# Patient Record
Sex: Female | Born: 2000 | Race: White | Hispanic: No | Marital: Single | Smoking: Never smoker
Health system: Southern US, Community
[De-identification: ages and names within clinical notes are randomized; demographics above are authoritative.]

## PROBLEM LIST (undated history)

## (undated) DIAGNOSIS — G43909 Migraine, unspecified, not intractable, without status migrainosus: Secondary | ICD-10-CM

## (undated) HISTORY — PX: TYMPANOSTOMY TUBE PLACEMENT: SHX32

---
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
## 2020-02-02 ENCOUNTER — Encounter (HOSPITAL_BASED_OUTPATIENT_CLINIC_OR_DEPARTMENT_OTHER): Payer: Self-pay | Admitting: *Deleted

## 2020-02-02 ENCOUNTER — Emergency Department (HOSPITAL_BASED_OUTPATIENT_CLINIC_OR_DEPARTMENT_OTHER): Payer: BLUE CROSS/BLUE SHIELD

## 2020-02-02 ENCOUNTER — Other Ambulatory Visit: Payer: Self-pay

## 2020-02-02 ENCOUNTER — Inpatient Hospital Stay (HOSPITAL_BASED_OUTPATIENT_CLINIC_OR_DEPARTMENT_OTHER)
Admission: EM | Admit: 2020-02-02 | Discharge: 2020-02-04 | DRG: 060 | Disposition: A | Discharge status: Home / Self Care | Payer: BLUE CROSS/BLUE SHIELD | Attending: Internal Medicine | Admitting: Internal Medicine

## 2020-02-02 ENCOUNTER — Emergency Department (HOSPITAL_COMMUNITY): Payer: BLUE CROSS/BLUE SHIELD

## 2020-02-02 DIAGNOSIS — R112 Nausea with vomiting, unspecified: Secondary | ICD-10-CM | POA: Diagnosis not present

## 2020-02-02 DIAGNOSIS — G35 Multiple sclerosis: Principal | ICD-10-CM | POA: Diagnosis present

## 2020-02-02 DIAGNOSIS — Z833 Family history of diabetes mellitus: Secondary | ICD-10-CM | POA: Diagnosis not present

## 2020-02-02 DIAGNOSIS — G43409 Hemiplegic migraine, not intractable, without status migrainosus: Secondary | ICD-10-CM | POA: Diagnosis not present

## 2020-02-02 DIAGNOSIS — G9389 Other specified disorders of brain: Secondary | ICD-10-CM

## 2020-02-02 DIAGNOSIS — G379 Demyelinating disease of central nervous system, unspecified: Secondary | ICD-10-CM | POA: Diagnosis present

## 2020-02-02 DIAGNOSIS — E860 Dehydration: Secondary | ICD-10-CM | POA: Diagnosis not present

## 2020-02-02 DIAGNOSIS — Z20822 Contact with and (suspected) exposure to covid-19: Secondary | ICD-10-CM | POA: Diagnosis not present

## 2020-02-02 DIAGNOSIS — Z8249 Family history of ischemic heart disease and other diseases of the circulatory system: Secondary | ICD-10-CM | POA: Diagnosis not present

## 2020-02-02 HISTORY — DX: Migraine, unspecified, not intractable, without status migrainosus: G43.909

## 2020-02-02 LAB — CBC WITH DIFFERENTIAL/PLATELET
Abs Immature Granulocytes: 0.01 10*3/uL (ref 0.00–0.07)
Basophils Absolute: 0.1 10*3/uL (ref 0.0–0.1)
Basophils Relative: 1 %
Eosinophils Absolute: 0.3 10*3/uL (ref 0.0–0.5)
Eosinophils Relative: 5 %
HCT: 41.8 % (ref 36.0–46.0)
Hemoglobin: 13.8 g/dL (ref 12.0–15.0)
Immature Granulocytes: 0 %
Lymphocytes Relative: 33 %
Lymphs Abs: 2.4 10*3/uL (ref 0.7–4.0)
MCH: 28.9 pg (ref 26.0–34.0)
MCHC: 33 g/dL (ref 30.0–36.0)
MCV: 87.6 fL (ref 80.0–100.0)
Monocytes Absolute: 0.7 10*3/uL (ref 0.1–1.0)
Monocytes Relative: 9 %
Neutro Abs: 3.7 10*3/uL (ref 1.7–7.7)
Neutrophils Relative %: 52 %
Platelets: 258 10*3/uL (ref 150–400)
RBC: 4.77 MIL/uL (ref 3.87–5.11)
RDW: 12 % (ref 11.5–15.5)
WBC: 7.2 10*3/uL (ref 4.0–10.5)
nRBC: 0 % (ref 0.0–0.2)

## 2020-02-02 LAB — BASIC METABOLIC PANEL
Anion gap: 11 (ref 5–15)
BUN: 7 mg/dL (ref 6–20)
CO2: 25 mmol/L (ref 22–32)
Calcium: 9.5 mg/dL (ref 8.9–10.3)
Chloride: 105 mmol/L (ref 98–111)
Creatinine, Ser: 0.66 mg/dL (ref 0.44–1.00)
GFR calc Af Amer: >60 mL/min (ref >60)
GFR calc non Af Amer: >60 mL/min (ref >60)
Glucose, Bld: 93 mg/dL (ref 70–99)
Potassium: 4 mmol/L (ref 3.5–5.1)
Sodium: 141 mmol/L (ref 135–145)

## 2020-02-02 LAB — PREGNANCY, URINE: Preg Test, Ur: NEGATIVE

## 2020-02-02 IMAGING — MR MR CERVICAL SPINE WO/W CM
6 series · 31 of 48 positions shown · IV contrast (gadavist)
Comparison: Prior head CT from earlier the same day.

EXAM:
MRI HEAD WITHOUT AND WITH CONTRAST

MRI CERVICAL SPINE WITHOUT AND WITH CONTRAST
MRI THORACIC SPINE WITHOUT AND WITH CONTRAST
TECHNIQUE: Multiplanar, multiecho pulse sequences of the brain and surrounding
structures, and cervical spine, to include the craniocervical
junction and cervicothoracic junction, and thoracic spine were
obtained without and with intravenous contrast.
CONTRAST:  9mL GADAVIST GADOBUTROL 1 MMOL/ML IV SOLN

[Series 9: T2 · sagittal · 3.0mm · 0.86mm/px · 4 of 15 slices shown (1 of 2)]
[im 1/15]
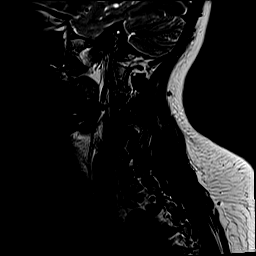
[im 5/15]
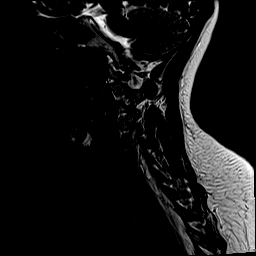
[im 10/15]
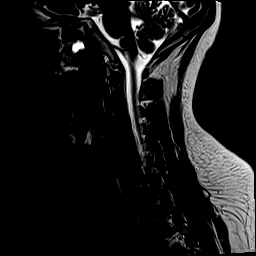
[im 15/15]
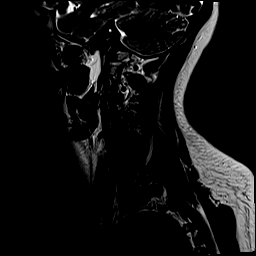

[Series 10: T1 · sagittal · 3.0mm · 0.86mm/px · 4 of 15 slices shown (1 of 2)]
[im 1/15]
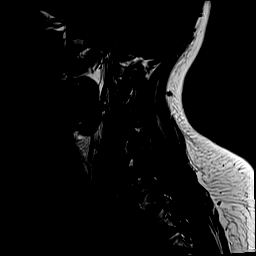
[im 5/15]
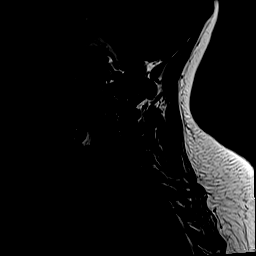
[im 10/15]
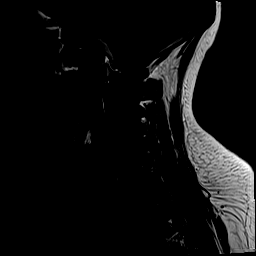
[im 15/15]
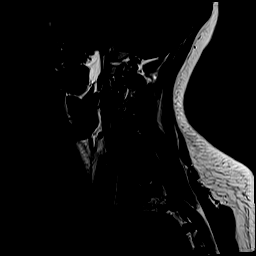

[Series 11: STIR · sagittal · 3.0mm · 0.86mm/px · 4 of 15 slices shown]
[im 1/15]
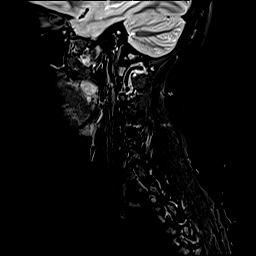
[im 5/15]
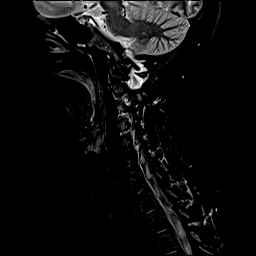
[im 10/15]
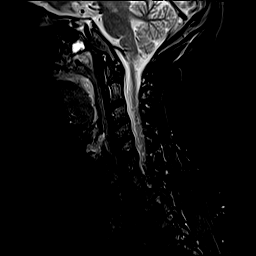
[im 15/15]
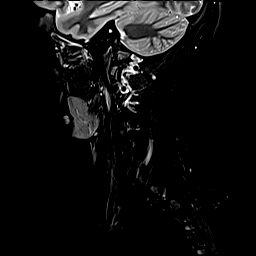

[Series 12: T2 · axial · 3.0mm · 0.78mm/px · z∈[-315,-193]mm · 9 of 40 slices shown (2 of 2)]
[im 1/40]
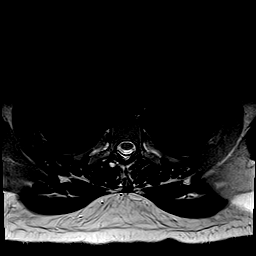
[im 8/40]
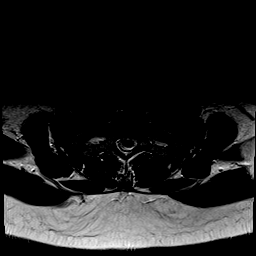
[im 11/40]
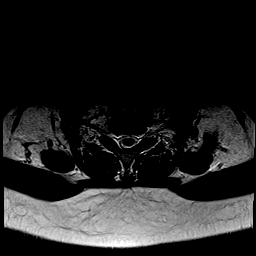
[im 18/40]
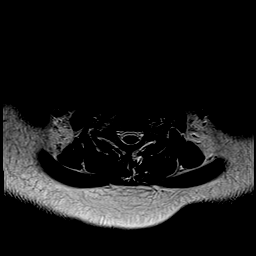
[im 22/40]
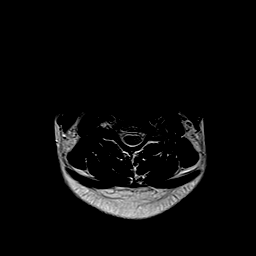
[im 29/40]
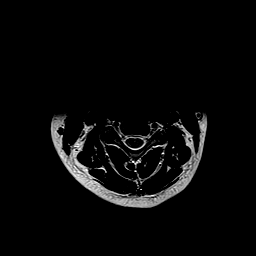
[im 32/40]
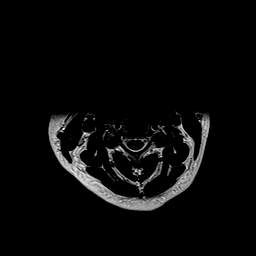
[im 36/40]
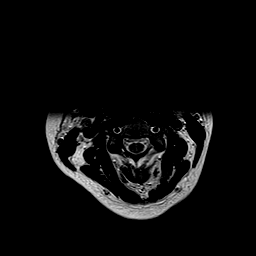
[im 40/40]
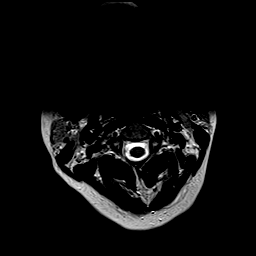

[Series 13: GRE · axial · 3.0mm · 0.39mm/px · 1 of 40 slices shown]
[im 1/40]
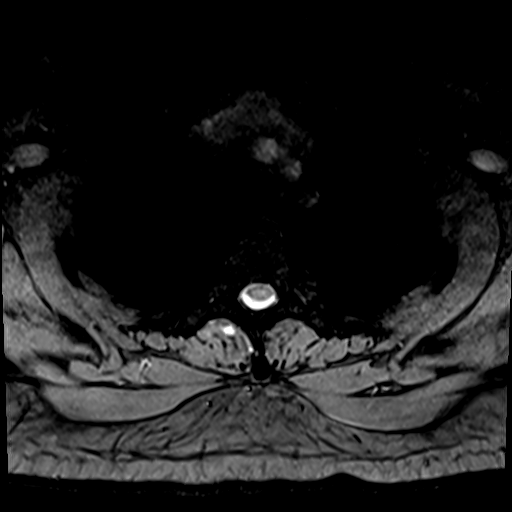

[Series 14: T1 · axial · 3.0mm · 0.39mm/px · z∈[-315,-193]mm · 9 of 40 slices shown (2 of 2)]
[im 1/40]
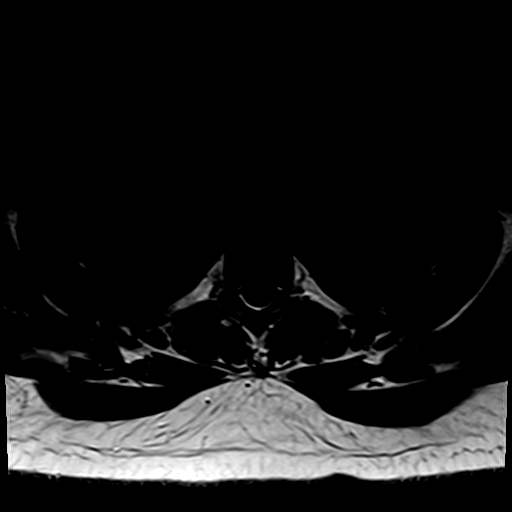
[im 8/40]
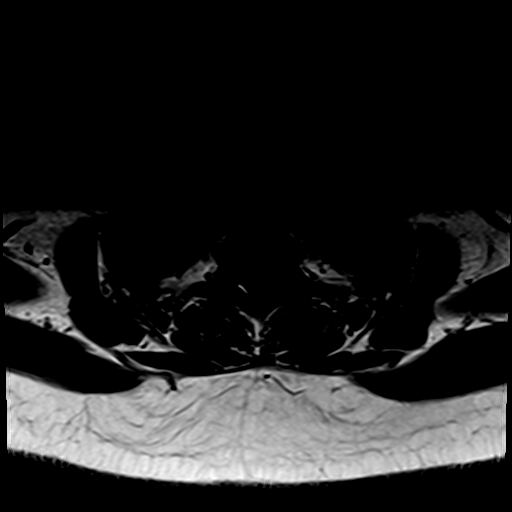
[im 11/40]
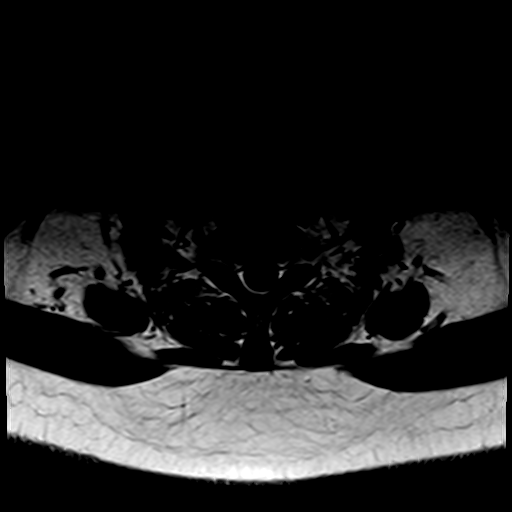
[im 18/40]
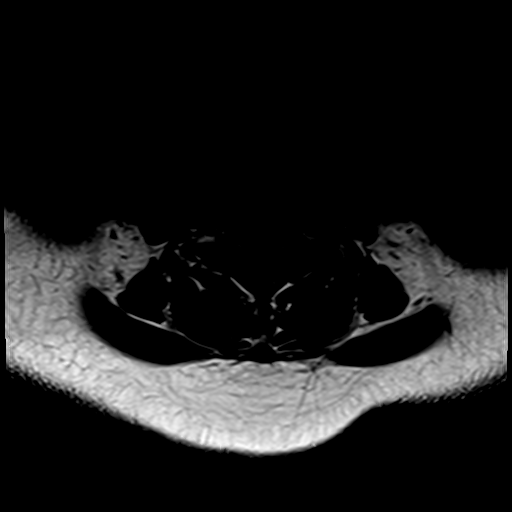
[im 22/40]
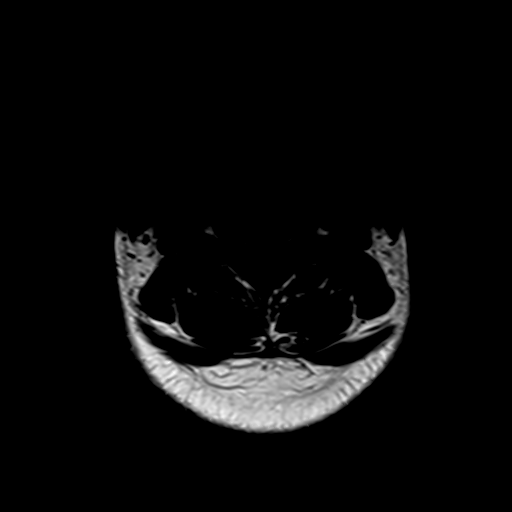
[im 29/40]
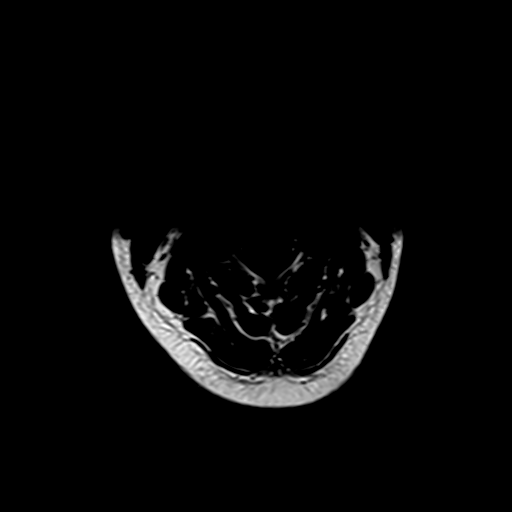
[im 32/40]
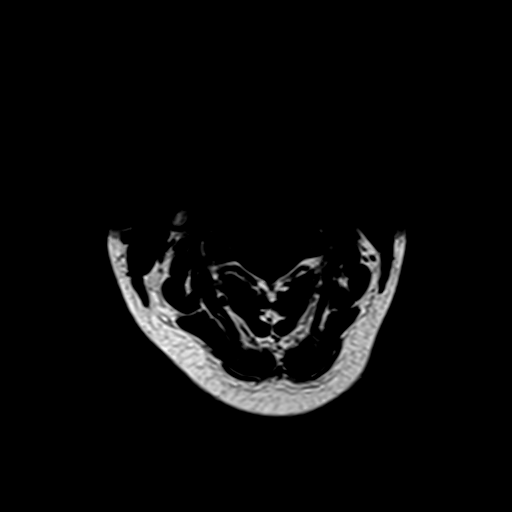
[im 36/40]
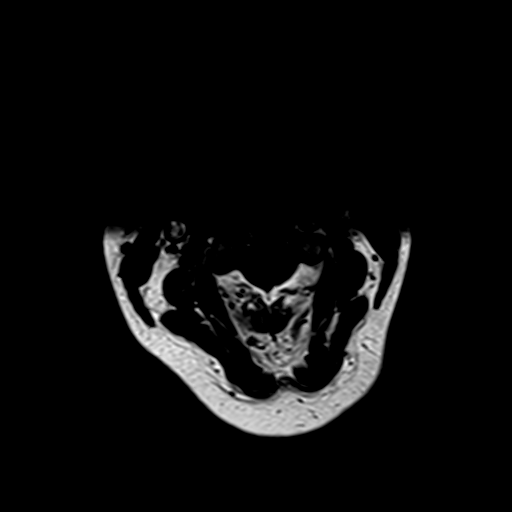
[im 40/40]
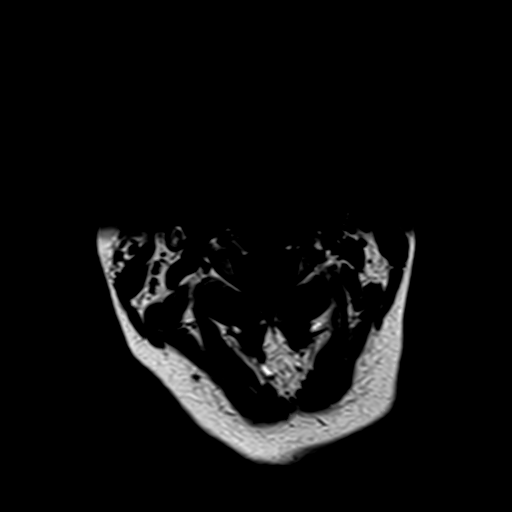

[31 of 48 positions shown; findings below may reference images not displayed]

FINDINGS: MRI HEAD FINDINGS

Brain: Cerebral volume within normal limits. Multiple scattered foci
of T2/FLAIR hyperintensity seen involving the periventricular, deep,
and subcortical white matter of both cerebral hemispheres, highly
suspicious for demyelinating disease. Several of these foci are
oriented perpendicular to the lateral ventricles. A dominant
subcortical lesion measuring 1.6 cm noted at the subcortical left
parietal lobe (series 12, image 15). At least 1 infratentorial
lesion seen at the left cerebellum. Following contrast
administration, patchy enhancement seen about several lesions, most
notable at the left parietal lobe (series 49, image 90 and right
frontal lobe (series 49, image 96).

No evidence for acute or subacute infarct. Gray-white matter
differentiation maintained. No encephalomalacia to suggest chronic
cortical infarction. No evidence for acute or chronic intracranial
hemorrhage.

No mass lesion, midline shift or mass effect. No hydrocephalus or
extra-axial fluid collection. Pituitary gland within normal limits
for age. Midline structures intact.

Vascular: Major intracranial vascular flow voids are maintained.

Skull and upper cervical spine: Craniocervical junction within
normal limits. Bone marrow signal intensity normal. No scalp soft
tissue abnormality.

Sinuses/Orbits: Globes orbital soft tissues within normal limits.
Small left maxillary sinus retention cyst. Paranasal sinuses are
otherwise clear. No significant mastoid effusion. Inner ear
structures grossly normal.

Other: None.

MRI CERVICAL SPINE FINDINGS

Alignment: Straightening of the normal cervical lordosis. No
listhesis.

Vertebrae: Vertebral body height well maintained without acute or
chronic fracture. Bone marrow signal intensity within normal limits.
No discrete or worrisome osseous lesions. No abnormal marrow edema
or enhancement.

Cord: Patchy signal abnormality seen at the medulla/craniocervical
junction (series 11, image 9). Additional subtle patchy signal
abnormality within the left dorsal cord at the level of C2-3 (series
11, image 9). Focal cord signal abnormality within the
central/dorsal cord at the level of C3-4 (series 11, image 8) patchy
cord signal abnormality at the dorsal cord at the level of C5
(series 11, image 8). Additional vague signal abnormality at the
level of C6. Findings consistent with demyelinating disease. No
abnormal enhancement to suggest active demyelination. Underlying
cord caliber within normal limits.

Posterior Fossa, vertebral arteries, paraspinal tissues: Negative.

Disc levels: No significant underlying disc pathology. No stenosis
or neural impingement.

MRI THORACIC SPINE FINDINGS

Alignment: Vertebral bodies normally aligned with preservation of
the normal thoracic kyphosis. No listhesis.

Vertebrae: Vertebral body height maintained without acute or chronic
fracture. Bone marrow signal intensity within normal limits. No
discrete or worrisome osseous lesions. No abnormal marrow edema or
enhancement.

Cord: No convincing cord signal abnormality seen within the thoracic
spine to suggest demyelinating disease. Underlying cord caliber
within normal limits. No abnormal enhancement.

Paraspinous soft tissues: Negative.

Disc levels: Minimal/early spondylosis noted at T5-6 and T6-7
without significant stenosis or impingement. Minimal facet
hypertrophy noted within the lower thoracic spine. No significant
stenosis or neural impingement.
IMPRESSION: MRI HEAD IMPRESSION:

1. Patchy multifocal signal abnormality involving both the
supratentorial and infratentorial cerebral white matter, compatible
with demyelinating disease/multiple sclerosis. Patchy post-contrast
enhancement about several lesions consistent with active
demyelination.
2. Otherwise normal brain MRI.

MRI CERVICAL SPINE IMPRESSION:

1. Patchy multifocal signal abnormality throughout the cervical
spinal cord extending from the brainstem through C6, consistent with
demyelinating disease/multiple sclerosis. No evidence for active
demyelination within the cervical spine.
2. Otherwise normal MRI of the cervical spine.

MRI THORACIC SPINE IMPRESSION:

Negative MRI of the thoracic spine. No convincing cord signal
changes to suggest involvement with demyelinating disease. No
abnormal enhancement.

## 2020-02-02 IMAGING — MR MR HEAD WO/W CM
4 of 5 series · 20 of 48 positions shown · IV contrast (9 ML GADAVIST)
Comparison: Prior head CT from earlier the same day.

EXAM:
MRI HEAD WITHOUT AND WITH CONTRAST

MRI CERVICAL SPINE WITHOUT AND WITH CONTRAST
MRI THORACIC SPINE WITHOUT AND WITH CONTRAST
TECHNIQUE: Multiplanar, multiecho pulse sequences of the brain and surrounding
structures, and cervical spine, to include the craniocervical
junction and cervicothoracic junction, and thoracic spine were
obtained without and with intravenous contrast.
CONTRAST:  9mL GADAVIST GADOBUTROL 1 MMOL/ML IV SOLN

[Series 48: T2 post-contrast · coronal · 5.0mm · 0.72mm/px · 5 of 28 slices shown]
[im 1/28]
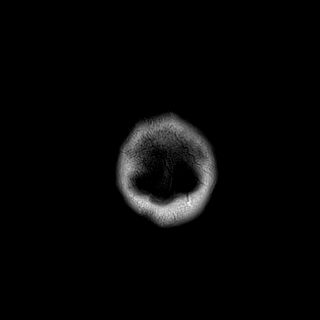
[im 7/28]
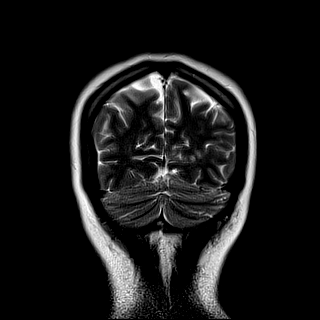
[im 14/28]
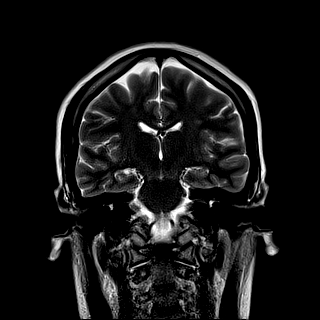
[im 21/28]
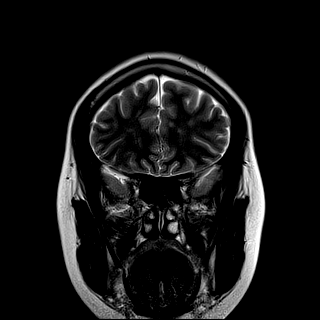
[im 28/28]
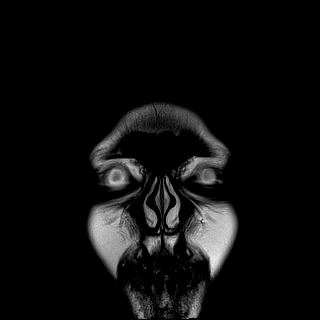

[Series 50: T1 post-contrast · coronal · 5.0mm · 0.34mm/px · 5 of 29 slices shown (1 of 3)]
[im 1/29]
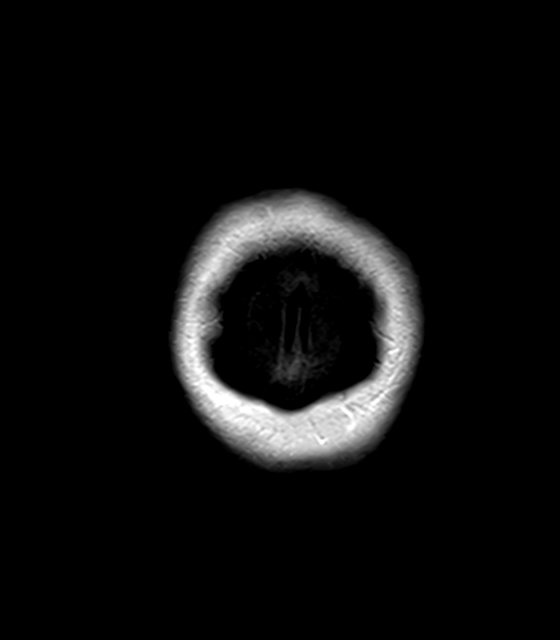
[im 8/29]
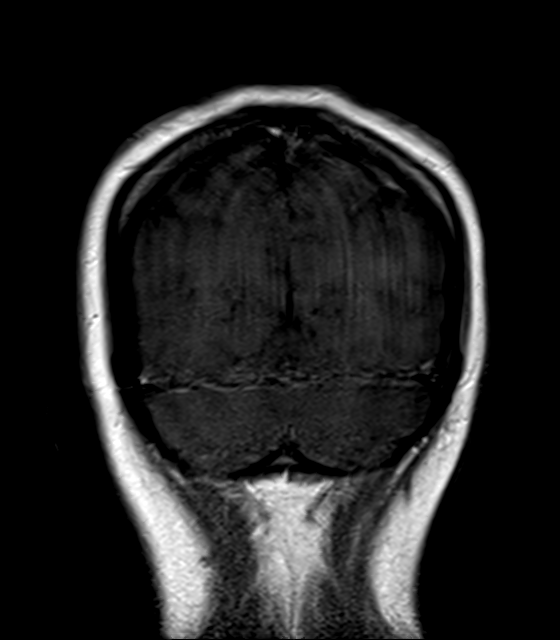
[im 15/29]
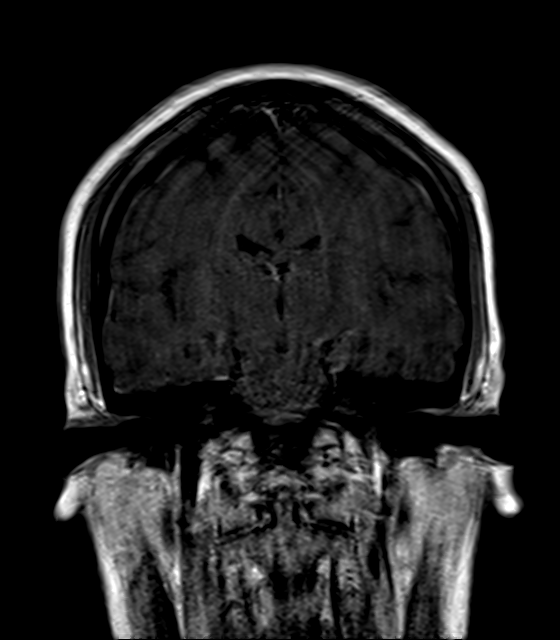
[im 22/29]
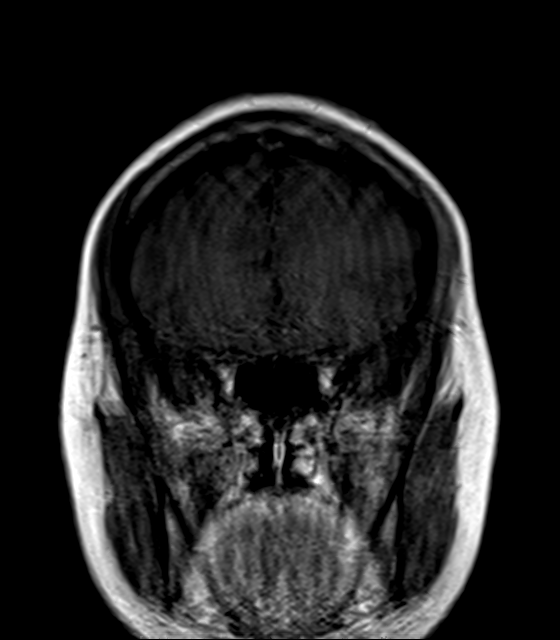
[im 29/29]
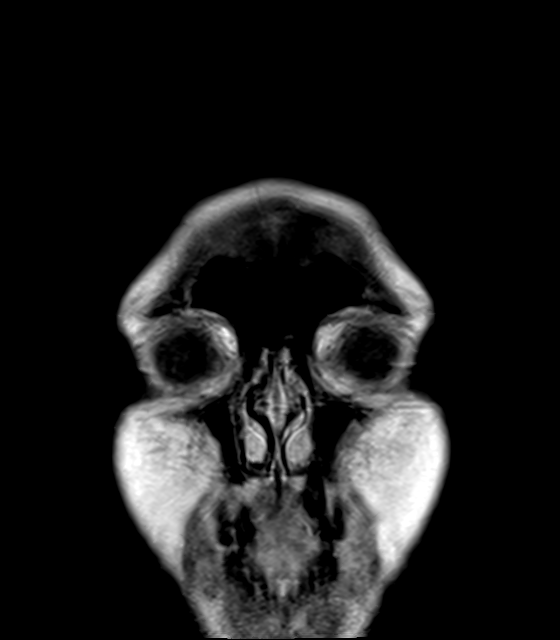

[Series 51: T1 post-contrast · sagittal · 4.0mm · 0.90mm/px · 5 of 31 slices shown (2 of 3)]
[im 1/31]
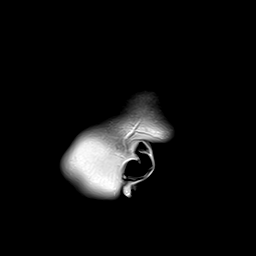
[im 8/31]
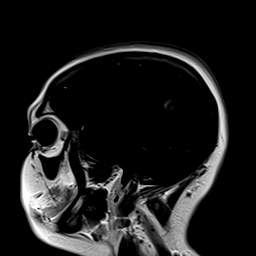
[im 16/31]
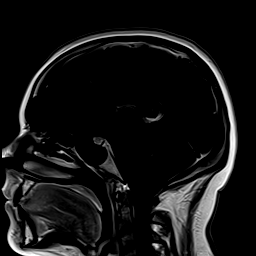
[im 23/31]
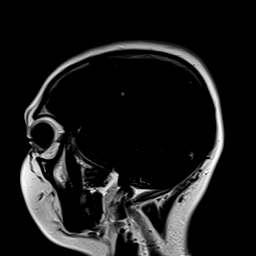
[im 31/31]
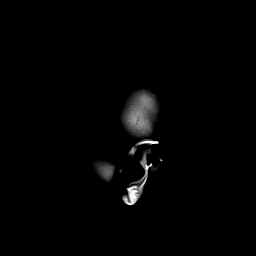

[Series 52: T1 post-contrast · coronal · 5.0mm · 0.43mm/px · 5 of 29 slices shown (3 of 3)]
[im 1/29]
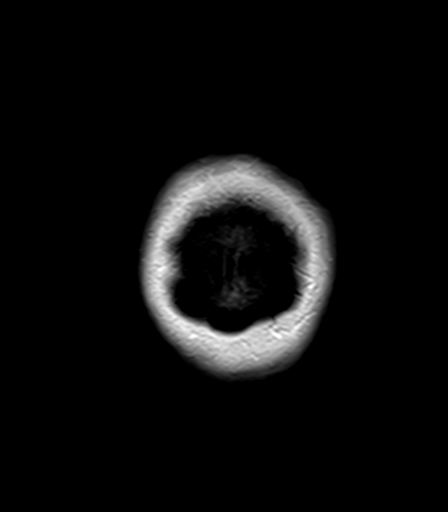
[im 8/29]
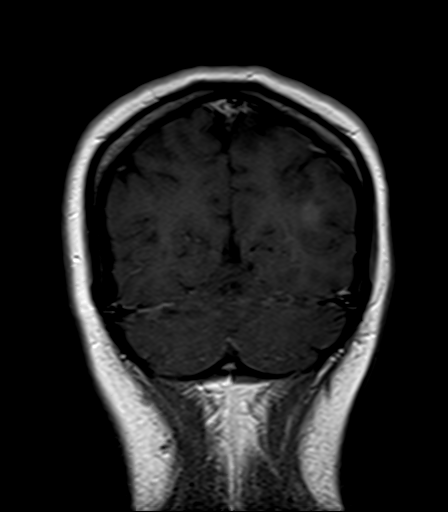
[im 15/29]
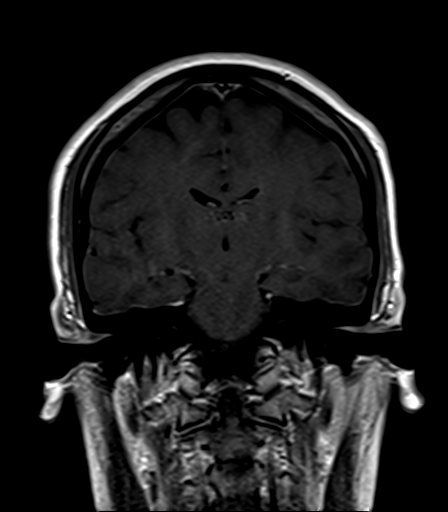
[im 22/29]
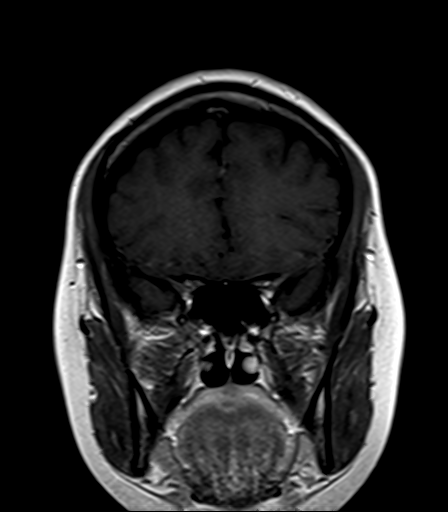
[im 29/29]
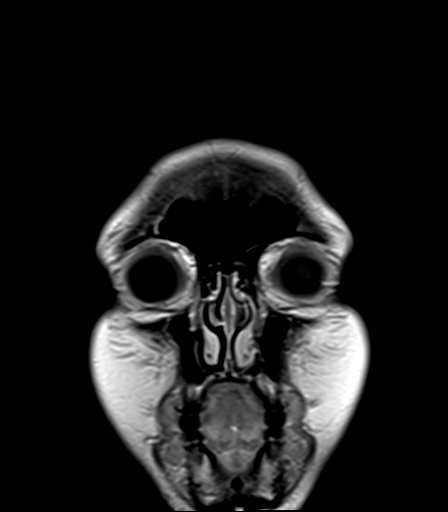

[20 of 48 positions shown; findings below may reference images not displayed]

FINDINGS: MRI HEAD FINDINGS

Brain: Cerebral volume within normal limits. Multiple scattered foci
of T2/FLAIR hyperintensity seen involving the periventricular, deep,
and subcortical white matter of both cerebral hemispheres, highly
suspicious for demyelinating disease. Several of these foci are
oriented perpendicular to the lateral ventricles. A dominant
subcortical lesion measuring 1.6 cm noted at the subcortical left
parietal lobe (series 12, image 15). At least 1 infratentorial
lesion seen at the left cerebellum. Following contrast
administration, patchy enhancement seen about several lesions, most
notable at the left parietal lobe (series 49, image 90 and right
frontal lobe (series 49, image 96).

No evidence for acute or subacute infarct. Gray-white matter
differentiation maintained. No encephalomalacia to suggest chronic
cortical infarction. No evidence for acute or chronic intracranial
hemorrhage.

No mass lesion, midline shift or mass effect. No hydrocephalus or
extra-axial fluid collection. Pituitary gland within normal limits
for age. Midline structures intact.

Vascular: Major intracranial vascular flow voids are maintained.

Skull and upper cervical spine: Craniocervical junction within
normal limits. Bone marrow signal intensity normal. No scalp soft
tissue abnormality.

Sinuses/Orbits: Globes orbital soft tissues within normal limits.
Small left maxillary sinus retention cyst. Paranasal sinuses are
otherwise clear. No significant mastoid effusion. Inner ear
structures grossly normal.

Other: None.

MRI CERVICAL SPINE FINDINGS

Alignment: Straightening of the normal cervical lordosis. No
listhesis.

Vertebrae: Vertebral body height well maintained without acute or
chronic fracture. Bone marrow signal intensity within normal limits.
No discrete or worrisome osseous lesions. No abnormal marrow edema
or enhancement.

Cord: Patchy signal abnormality seen at the medulla/craniocervical
junction (series 11, image 9). Additional subtle patchy signal
abnormality within the left dorsal cord at the level of C2-3 (series
11, image 9). Focal cord signal abnormality within the
central/dorsal cord at the level of C3-4 (series 11, image 8) patchy
cord signal abnormality at the dorsal cord at the level of C5
(series 11, image 8). Additional vague signal abnormality at the
level of C6. Findings consistent with demyelinating disease. No
abnormal enhancement to suggest active demyelination. Underlying
cord caliber within normal limits.

Posterior Fossa, vertebral arteries, paraspinal tissues: Negative.

Disc levels: No significant underlying disc pathology. No stenosis
or neural impingement.

MRI THORACIC SPINE FINDINGS

Alignment: Vertebral bodies normally aligned with preservation of
the normal thoracic kyphosis. No listhesis.

Vertebrae: Vertebral body height maintained without acute or chronic
fracture. Bone marrow signal intensity within normal limits. No
discrete or worrisome osseous lesions. No abnormal marrow edema or
enhancement.

Cord: No convincing cord signal abnormality seen within the thoracic
spine to suggest demyelinating disease. Underlying cord caliber
within normal limits. No abnormal enhancement.

Paraspinous soft tissues: Negative.

Disc levels: Minimal/early spondylosis noted at T5-6 and T6-7
without significant stenosis or impingement. Minimal facet
hypertrophy noted within the lower thoracic spine. No significant
stenosis or neural impingement.
IMPRESSION: MRI HEAD IMPRESSION:

1. Patchy multifocal signal abnormality involving both the
supratentorial and infratentorial cerebral white matter, compatible
with demyelinating disease/multiple sclerosis. Patchy post-contrast
enhancement about several lesions consistent with active
demyelination.
2. Otherwise normal brain MRI.

MRI CERVICAL SPINE IMPRESSION:

1. Patchy multifocal signal abnormality throughout the cervical
spinal cord extending from the brainstem through C6, consistent with
demyelinating disease/multiple sclerosis. No evidence for active
demyelination within the cervical spine.
2. Otherwise normal MRI of the cervical spine.

MRI THORACIC SPINE IMPRESSION:

Negative MRI of the thoracic spine. No convincing cord signal
changes to suggest involvement with demyelinating disease. No
abnormal enhancement.

## 2020-02-02 IMAGING — CT CT HEAD W/O CM
3 series · 14 of 47 positions shown, 16 images · non-contrast
Comparison: [DATE]

CLINICAL DATA: Headache, migraine without history of trauma. Nausea
and vomiting this morning.

EXAM:
CT HEAD WITHOUT CONTRAST
TECHNIQUE: Contiguous axial images were obtained from the base of the skull
through the vertex without intravenous contrast.

[Series 2: head wo · axial · 0.44mm/px · z∈[+796,+920]mm · 8 of 31 slices shown, 10 images]
[im 3/31  brain]
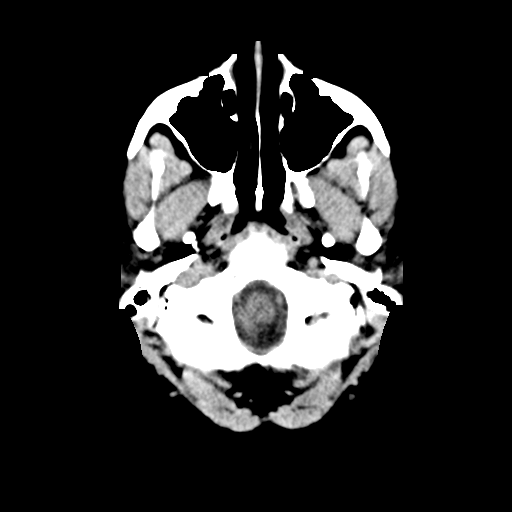
[im 3/31  bone]
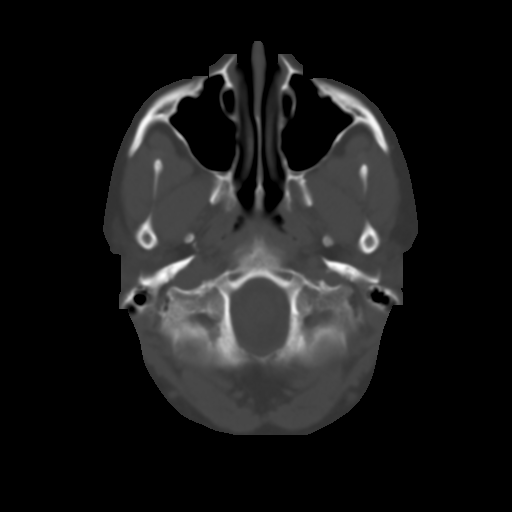
[im 7/31  brain]
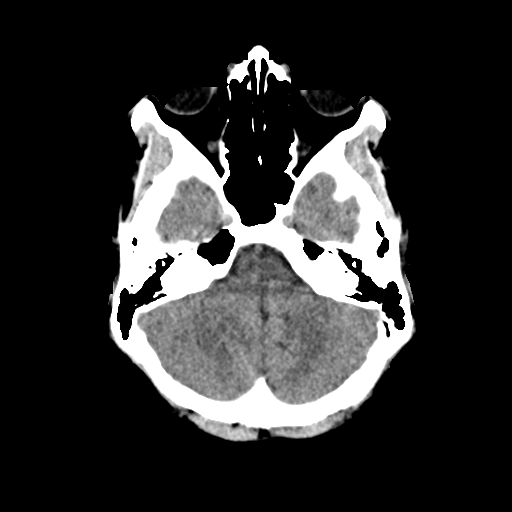
[im 10/31  brain]
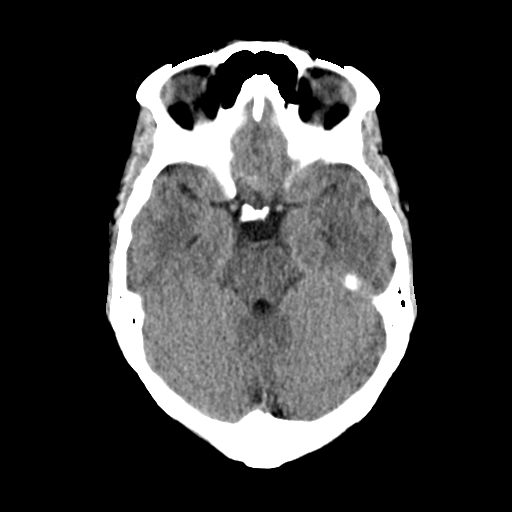
[im 14/31  brain]
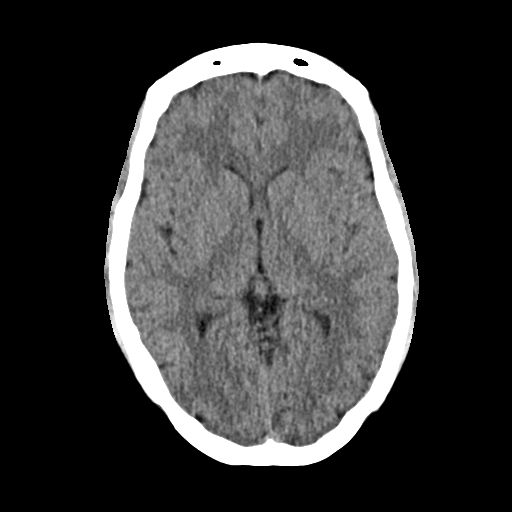
[im 17/31  brain]
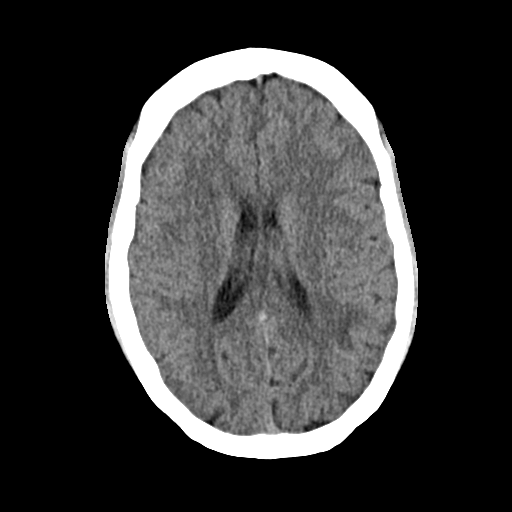
[im 17/31  bone]
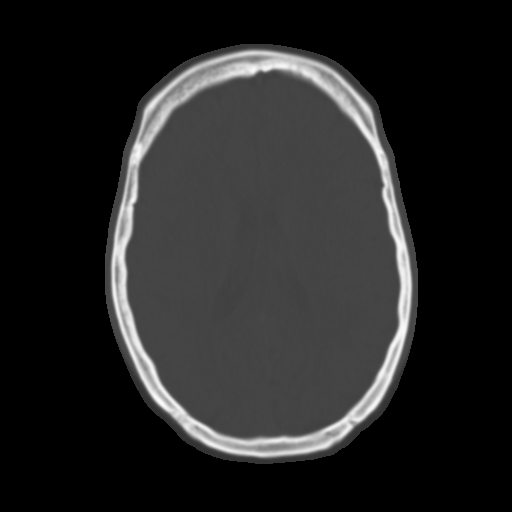
[im 21/31  brain]
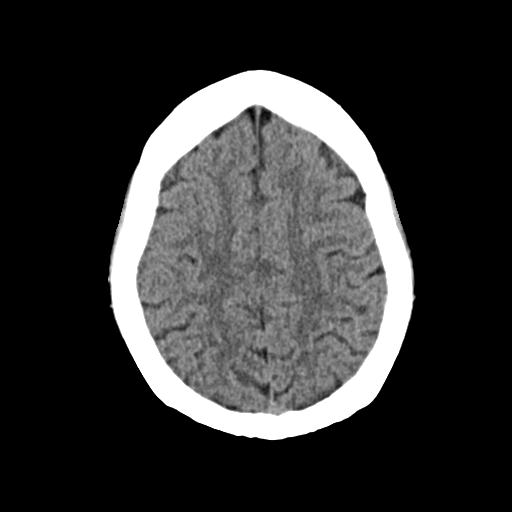
[im 24/31  brain]
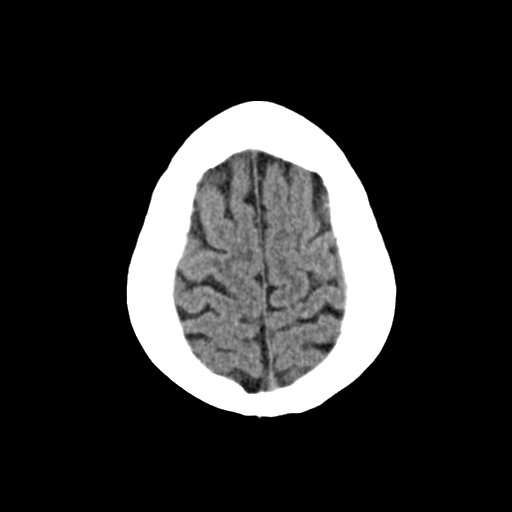
[im 28/31  brain]
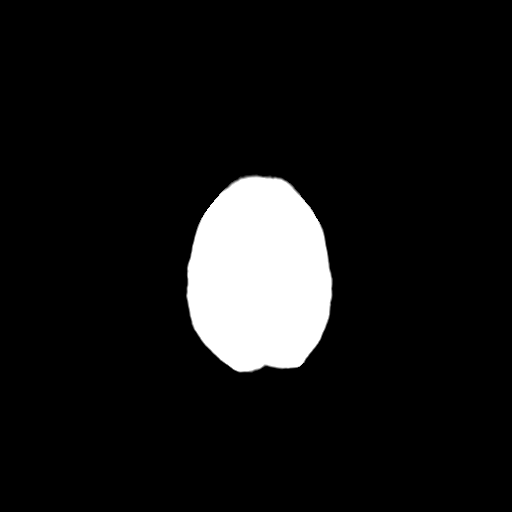

[Series 4: coronal soft · coronal · 0.31mm/px · 3 of 67 slices shown]
[im 23/67  brain]
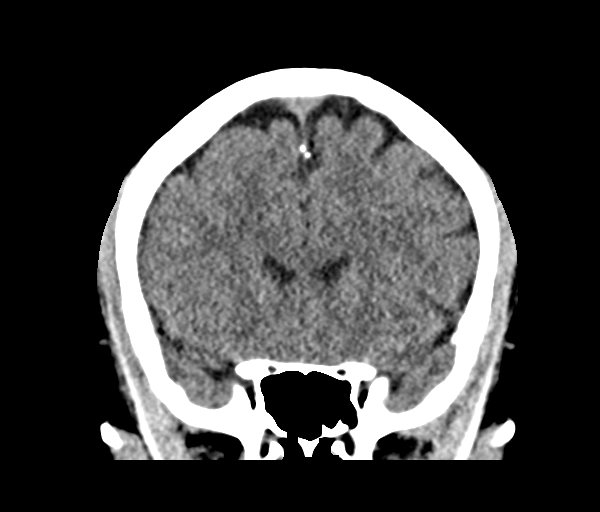
[im 30/67  brain]
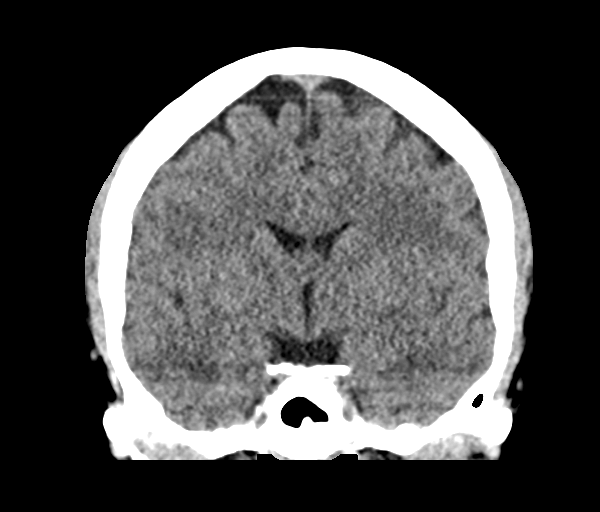
[im 37/67  brain]
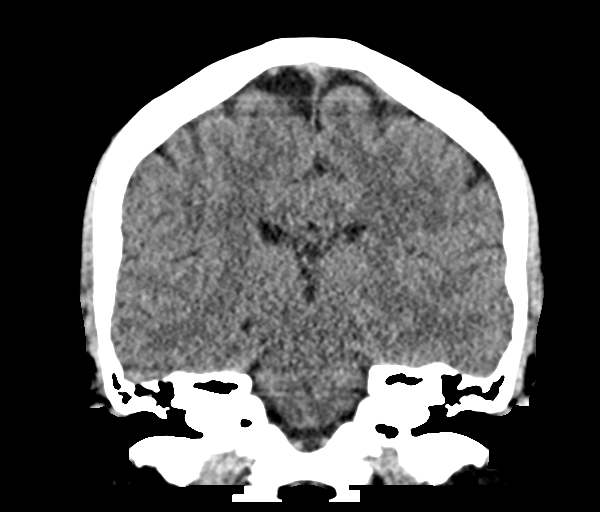

[Series 5: sag soft · sagittal · 0.31mm/px · 3 of 55 slices shown]
[im 19/55  brain]
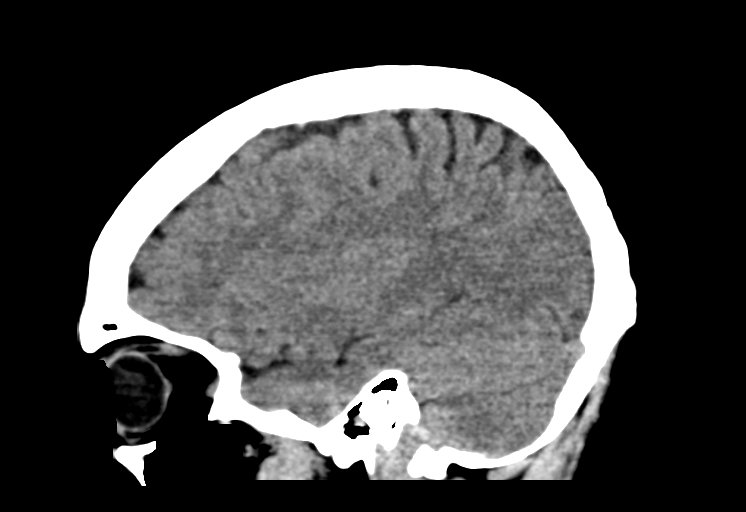
[im 28/55  brain]
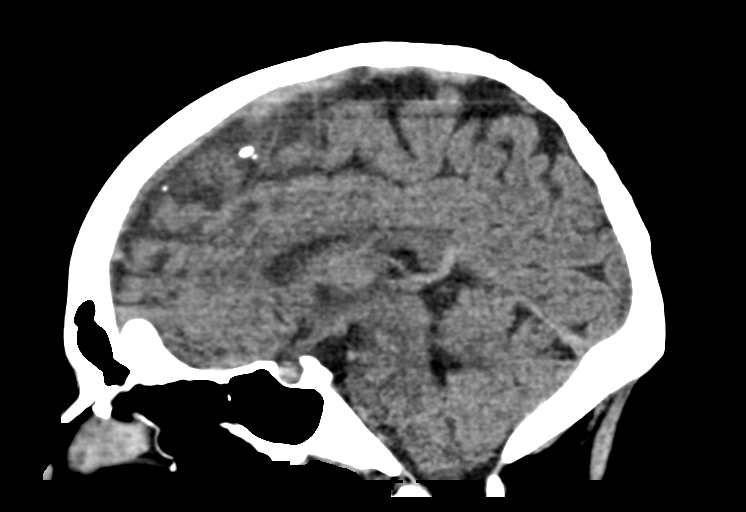
[im 37/55  brain]
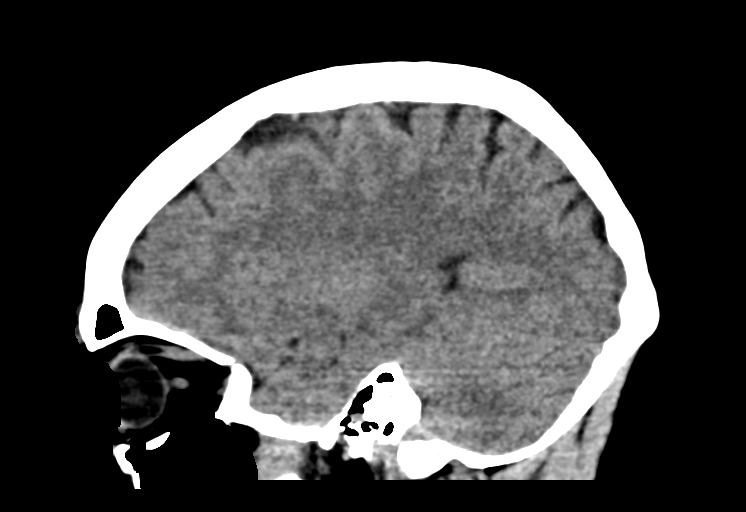

[14 of 47 positions shown; findings below may reference images not displayed]

FINDINGS: Brain: Vague area of hypodensity in the posterior LEFT parietal
lobe. This is at the gray-white junction with some extension towards
the cortex but without frank involvement of the cortex. No
intracranial hemorrhage. No mass effect. No midline shift. No
abnormal extra-axial fluid collection.

Vascular: No hyperdense vessel or unexpected calcification.

Skull: Normal. Negative for fracture or focal lesion.

Sinuses/Orbits: Sinuses are incompletely imaged without acute
abnormality. Orbits without acute process.

Other: None
IMPRESSION: 1. Vague area of hypoattenuation in subcortical white matter of the
LEFT parietal lobe is of uncertain significance, suspicious for
recent ischemia, underlying mass lesion or demyelinating process
among other differential considerations. Brain MRI is suggested for
further evaluation.

These results were called by telephone at the time of interpretation
on [DATE] at [DATE] to provider TOSANO , who verbally
acknowledged these results.

## 2020-02-02 IMAGING — MR MR HEAD WO/W CM
10 of 12 series · 32 of 48 positions shown · IV contrast (gadavist)
Comparison: Prior head CT from earlier the same day.

EXAM:
MRI HEAD WITHOUT AND WITH CONTRAST

MRI CERVICAL SPINE WITHOUT AND WITH CONTRAST
MRI THORACIC SPINE WITHOUT AND WITH CONTRAST
TECHNIQUE: Multiplanar, multiecho pulse sequences of the brain and surrounding
structures, and cervical spine, to include the craniocervical
junction and cervicothoracic junction, and thoracic spine were
obtained without and with intravenous contrast.
CONTRAST:  9mL GADAVIST GADOBUTROL 1 MMOL/ML IV SOLN

[Series 5: DWI · axial · 3.0mm · 0.88mm/px · z∈[-133,+7]mm · 3 of 48 slices shown (1 of 4)]
[im 1/48]
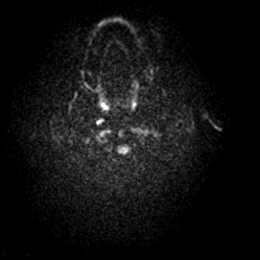
[im 24/48]
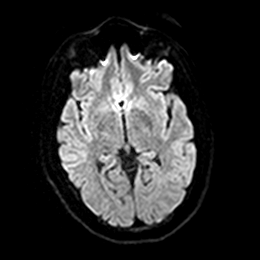
[im 48/48]
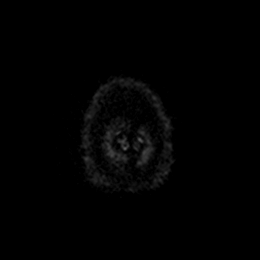

[Series 6: DWI · axial · 3.0mm · 0.88mm/px · z∈[-133,+7]mm · 4 of 46 slices shown (2 of 4)]
[im 1/46]
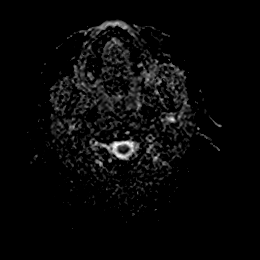
[im 16/46]
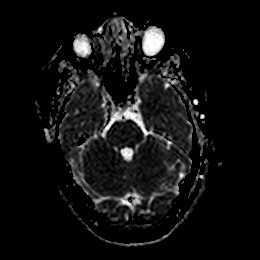
[im 31/46]
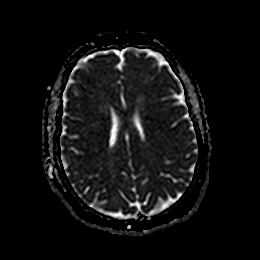
[im 46/46]
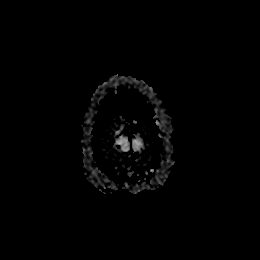

[Series 7: DWI · coronal · 4.0mm · 0.88mm/px · 3 of 35 slices shown (3 of 4)]
[im 1/35]
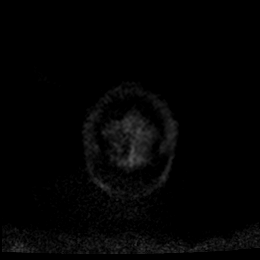
[im 18/35]
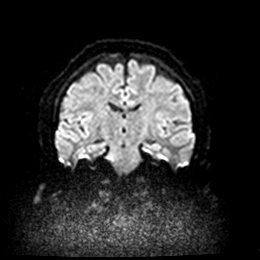
[im 35/35]
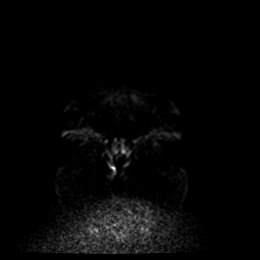

[Series 8: DWI · coronal · 4.0mm · 0.88mm/px · 3 of 35 slices shown (4 of 4)]
[im 1/35]
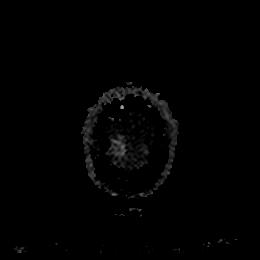
[im 18/35]
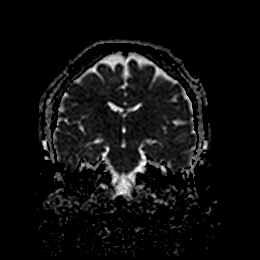
[im 35/35]
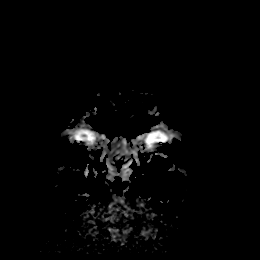

[Series 9: t2_space_[id number]-ir · sagittal · 1.0mm · 0.49mm/px · 8 of 160 slices shown]
[im 1/160]
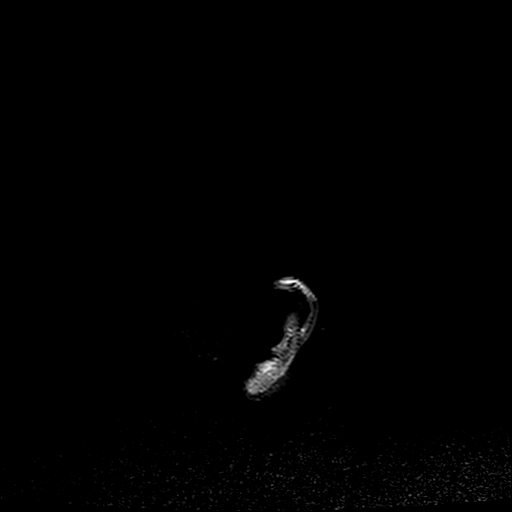
[im 27/160]
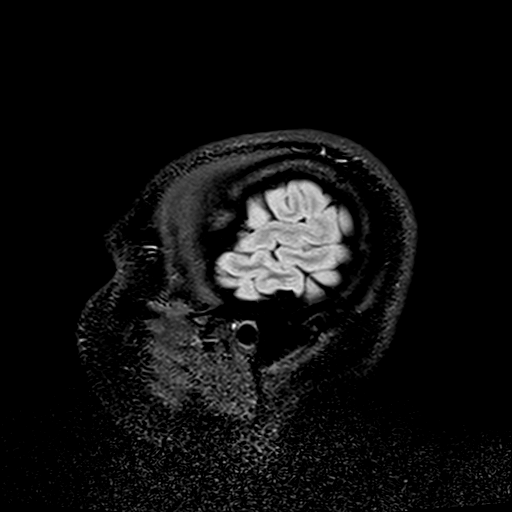
[im 54/160]
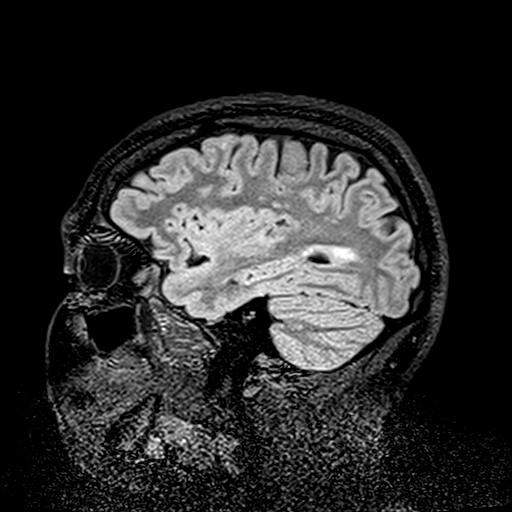
[im 67/160]
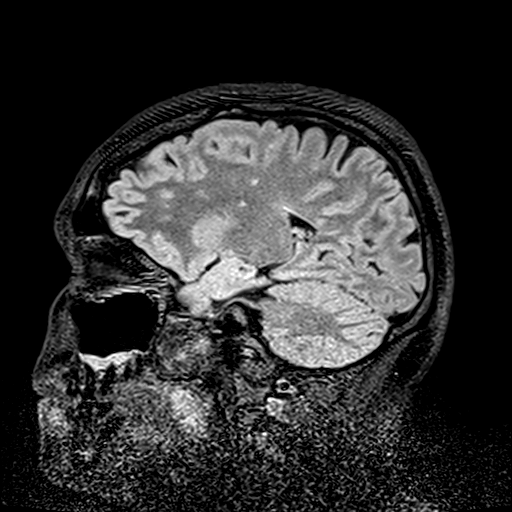
[im 93/160]
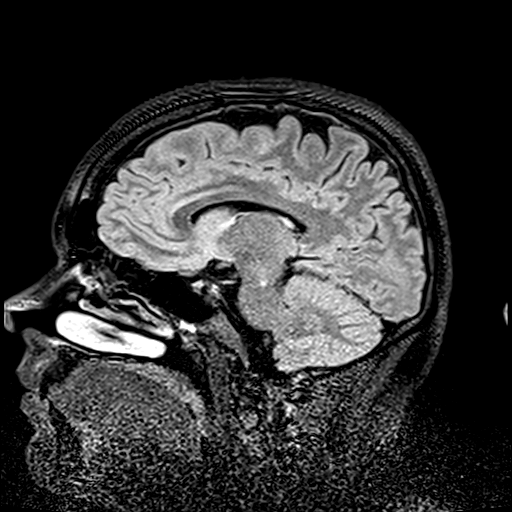
[im 107/160]
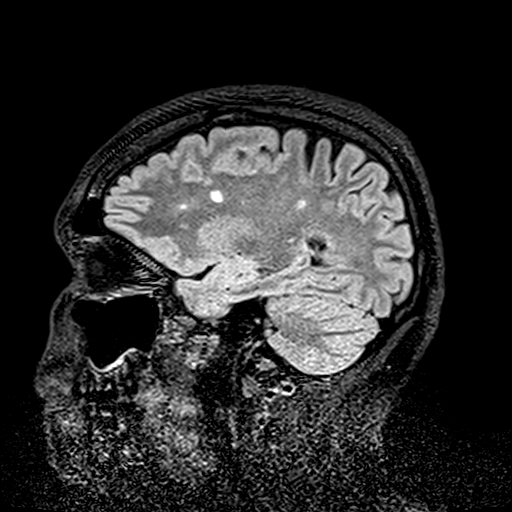
[im 133/160]
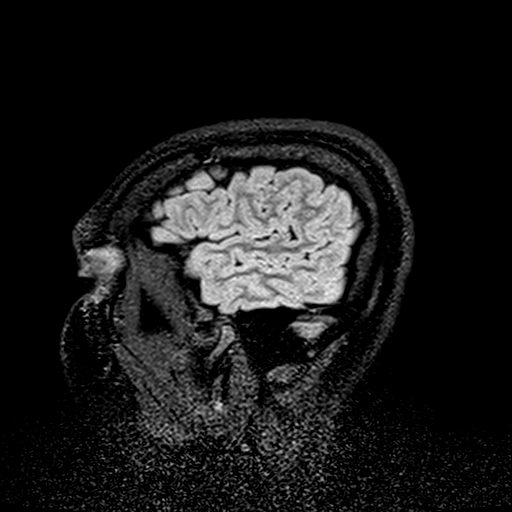
[im 160/160]
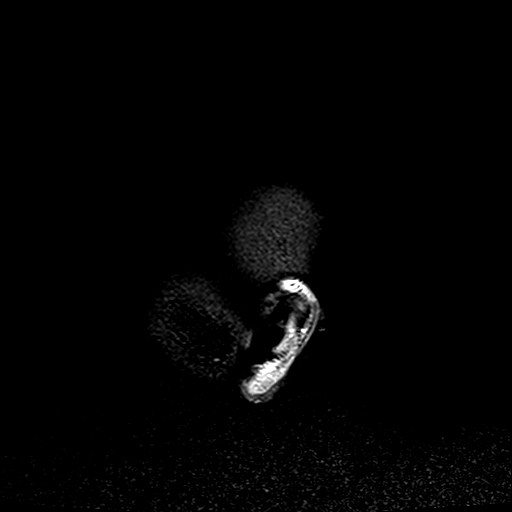

[Series 10: T1 · sagittal · 5.0mm · 0.75mm/px · 2 of 23 slices shown]
[im 1/23]
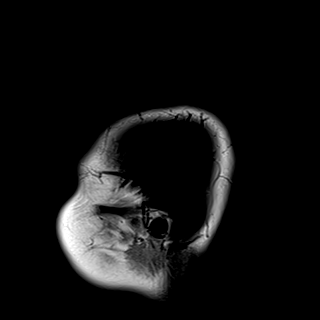
[im 23/23]
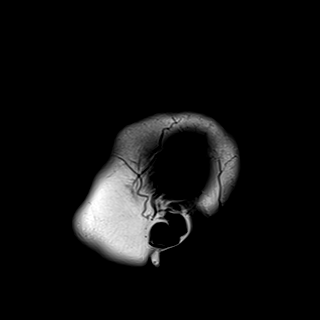

[Series 11: T2 · axial · 5.0mm · 0.72mm/px · z∈[-135,+8]mm · 2 of 25 slices shown]
[im 1/25]
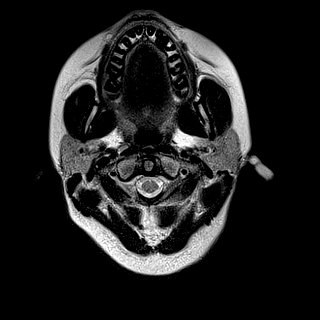
[im 25/25]
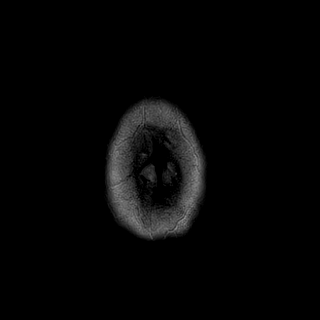

[Series 12: FLAIR · axial · 5.0mm · 0.45mm/px · z∈[-135,+8]mm · 2 of 25 slices shown]
[im 1/25]
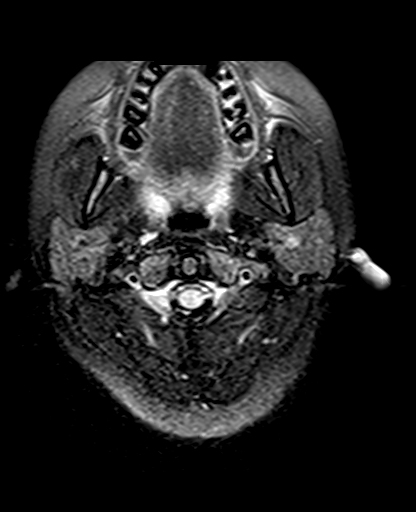
[im 25/25]
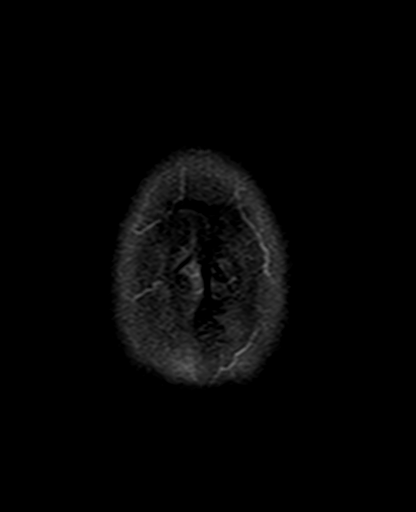

[Series 13: mag_images · axial · 3.0mm · 0.90mm/px · z∈[-145,+19]mm · 4 of 56 slices shown]
[im 1/56]
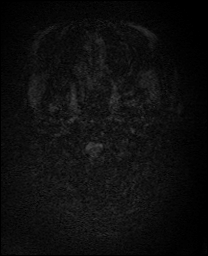
[im 19/56]
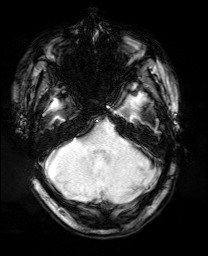
[im 37/56]
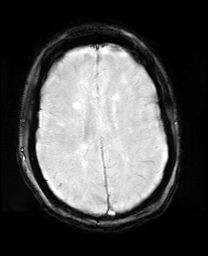
[im 56/56]
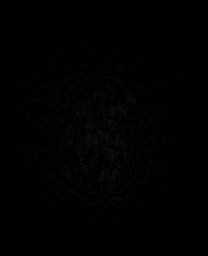

[Series 15: swi_images · axial · 3.0mm · 0.90mm/px · 1 of 56 slices shown]
[im 1/56]
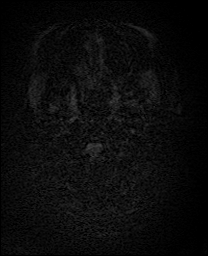

[32 of 48 positions shown; findings below may reference images not displayed]

FINDINGS: MRI HEAD FINDINGS

Brain: Cerebral volume within normal limits. Multiple scattered foci
of T2/FLAIR hyperintensity seen involving the periventricular, deep,
and subcortical white matter of both cerebral hemispheres, highly
suspicious for demyelinating disease. Several of these foci are
oriented perpendicular to the lateral ventricles. A dominant
subcortical lesion measuring 1.6 cm noted at the subcortical left
parietal lobe (series 12, image 15). At least 1 infratentorial
lesion seen at the left cerebellum. Following contrast
administration, patchy enhancement seen about several lesions, most
notable at the left parietal lobe (series 49, image 90 and right
frontal lobe (series 49, image 96).

No evidence for acute or subacute infarct. Gray-white matter
differentiation maintained. No encephalomalacia to suggest chronic
cortical infarction. No evidence for acute or chronic intracranial
hemorrhage.

No mass lesion, midline shift or mass effect. No hydrocephalus or
extra-axial fluid collection. Pituitary gland within normal limits
for age. Midline structures intact.

Vascular: Major intracranial vascular flow voids are maintained.

Skull and upper cervical spine: Craniocervical junction within
normal limits. Bone marrow signal intensity normal. No scalp soft
tissue abnormality.

Sinuses/Orbits: Globes orbital soft tissues within normal limits.
Small left maxillary sinus retention cyst. Paranasal sinuses are
otherwise clear. No significant mastoid effusion. Inner ear
structures grossly normal.

Other: None.

MRI CERVICAL SPINE FINDINGS

Alignment: Straightening of the normal cervical lordosis. No
listhesis.

Vertebrae: Vertebral body height well maintained without acute or
chronic fracture. Bone marrow signal intensity within normal limits.
No discrete or worrisome osseous lesions. No abnormal marrow edema
or enhancement.

Cord: Patchy signal abnormality seen at the medulla/craniocervical
junction (series 11, image 9). Additional subtle patchy signal
abnormality within the left dorsal cord at the level of C2-3 (series
11, image 9). Focal cord signal abnormality within the
central/dorsal cord at the level of C3-4 (series 11, image 8) patchy
cord signal abnormality at the dorsal cord at the level of C5
(series 11, image 8). Additional vague signal abnormality at the
level of C6. Findings consistent with demyelinating disease. No
abnormal enhancement to suggest active demyelination. Underlying
cord caliber within normal limits.

Posterior Fossa, vertebral arteries, paraspinal tissues: Negative.

Disc levels: No significant underlying disc pathology. No stenosis
or neural impingement.

MRI THORACIC SPINE FINDINGS

Alignment: Vertebral bodies normally aligned with preservation of
the normal thoracic kyphosis. No listhesis.

Vertebrae: Vertebral body height maintained without acute or chronic
fracture. Bone marrow signal intensity within normal limits. No
discrete or worrisome osseous lesions. No abnormal marrow edema or
enhancement.

Cord: No convincing cord signal abnormality seen within the thoracic
spine to suggest demyelinating disease. Underlying cord caliber
within normal limits. No abnormal enhancement.

Paraspinous soft tissues: Negative.

Disc levels: Minimal/early spondylosis noted at T5-6 and T6-7
without significant stenosis or impingement. Minimal facet
hypertrophy noted within the lower thoracic spine. No significant
stenosis or neural impingement.
IMPRESSION: MRI HEAD IMPRESSION:

1. Patchy multifocal signal abnormality involving both the
supratentorial and infratentorial cerebral white matter, compatible
with demyelinating disease/multiple sclerosis. Patchy post-contrast
enhancement about several lesions consistent with active
demyelination.
2. Otherwise normal brain MRI.

MRI CERVICAL SPINE IMPRESSION:

1. Patchy multifocal signal abnormality throughout the cervical
spinal cord extending from the brainstem through C6, consistent with
demyelinating disease/multiple sclerosis. No evidence for active
demyelination within the cervical spine.
2. Otherwise normal MRI of the cervical spine.

MRI THORACIC SPINE IMPRESSION:

Negative MRI of the thoracic spine. No convincing cord signal
changes to suggest involvement with demyelinating disease. No
abnormal enhancement.

## 2020-02-02 IMAGING — MR MR THORACIC SPINE WO/W CM
8 of 10 series · 30 of 48 positions shown · IV contrast (gadavist)
Comparison: Prior head CT from earlier the same day.

EXAM:
MRI HEAD WITHOUT AND WITH CONTRAST

MRI CERVICAL SPINE WITHOUT AND WITH CONTRAST
MRI THORACIC SPINE WITHOUT AND WITH CONTRAST
TECHNIQUE: Multiplanar, multiecho pulse sequences of the brain and surrounding
structures, and cervical spine, to include the craniocervical
junction and cervicothoracic junction, and thoracic spine were
obtained without and with intravenous contrast.
CONTRAST:  9mL GADAVIST GADOBUTROL 1 MMOL/ML IV SOLN

[Series 15: T1 · sagittal · 5.0mm · 1.46mm/px · 2 of 9 slices shown (1 of 4)]
[im 1/9]
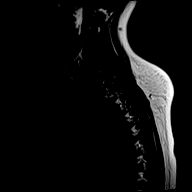
[im 9/9]
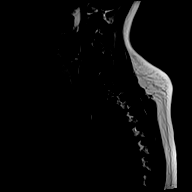

[Series 16: T1 · sagittal · 5.0mm · 1.23mm/px · 2 of 9 slices shown (2 of 4)]
[im 1/9]
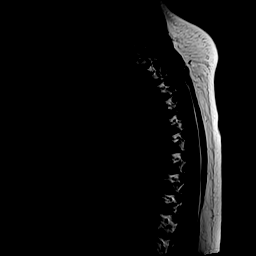
[im 9/9]
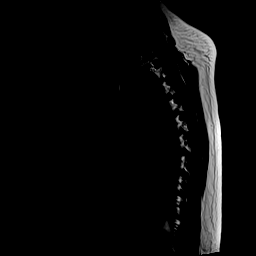

[Series 30: T2 · sagittal · 3.0mm · 1.06mm/px · 3 of 17 slices shown (1 of 2)]
[im 1/17]
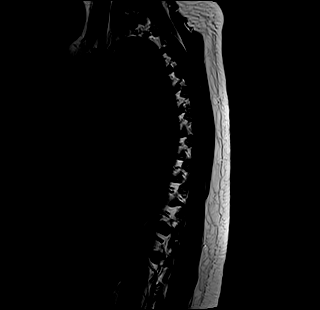
[im 9/17]
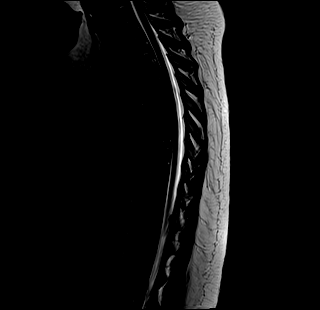
[im 17/17]
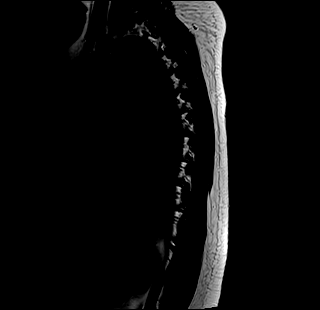

[Series 31: T1 · sagittal · 3.0mm · 1.06mm/px · 3 of 17 slices shown (3 of 4)]
[im 1/17]
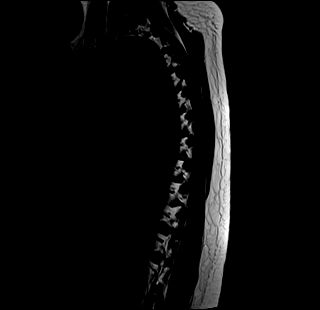
[im 9/17]
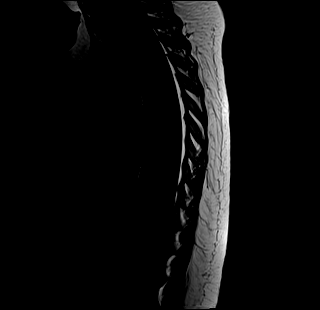
[im 17/17]
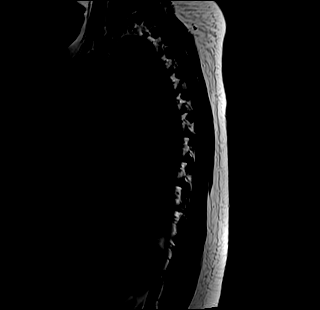

[Series 32: STIR · sagittal · 3.0mm · 0.53mm/px · 1 of 17 slices shown]
[im 1/17]
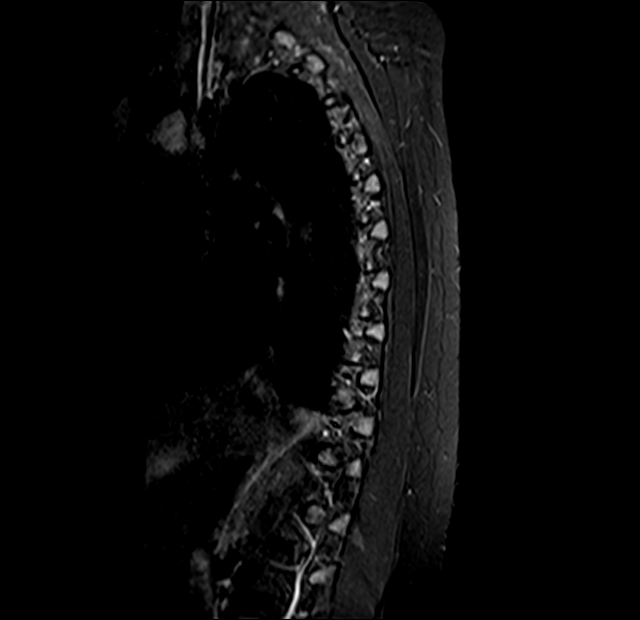

[Series 33: T2 · axial · 4.0mm · 0.74mm/px · z∈[-496,-267]mm · 8 of 39 slices shown (2 of 2)]
[im 1/39]
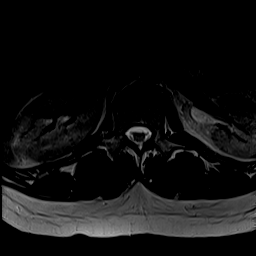
[im 6/39]
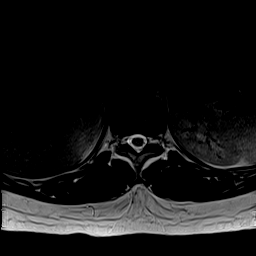
[im 11/39]
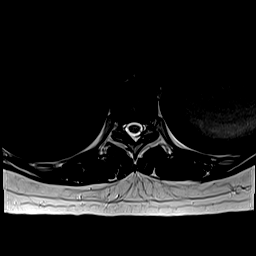
[im 17/39]
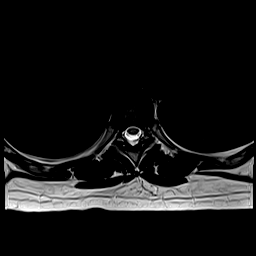
[im 22/39]
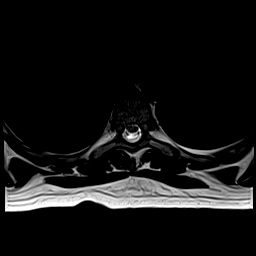
[im 28/39]
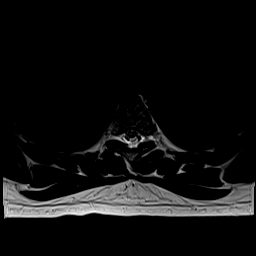
[im 33/39]
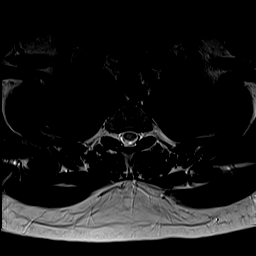
[im 39/39]
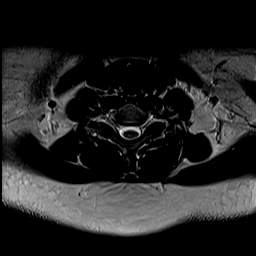

[Series 35: T1 · axial · non-contrast · 4.0mm · 0.37mm/px · z∈[-496,-267]mm · 8 of 39 slices shown (4 of 4)]
[im 1/39]
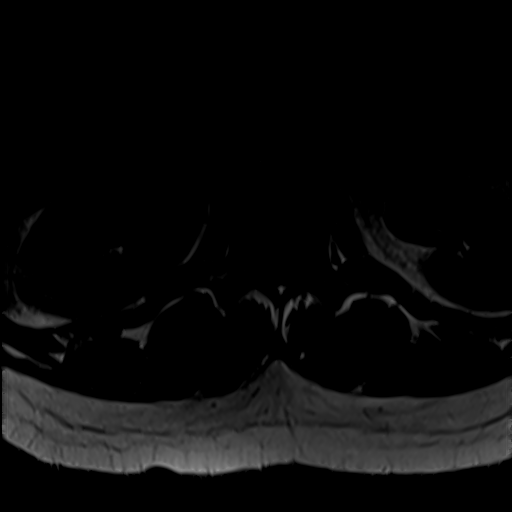
[im 6/39]
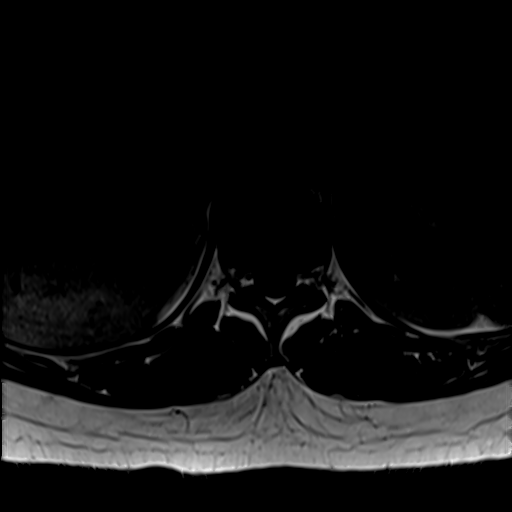
[im 11/39]
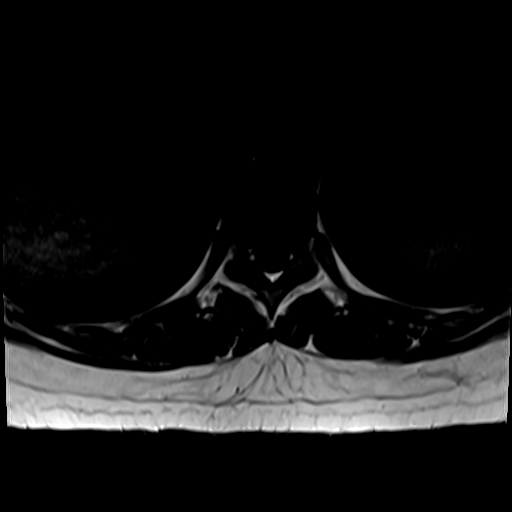
[im 17/39]
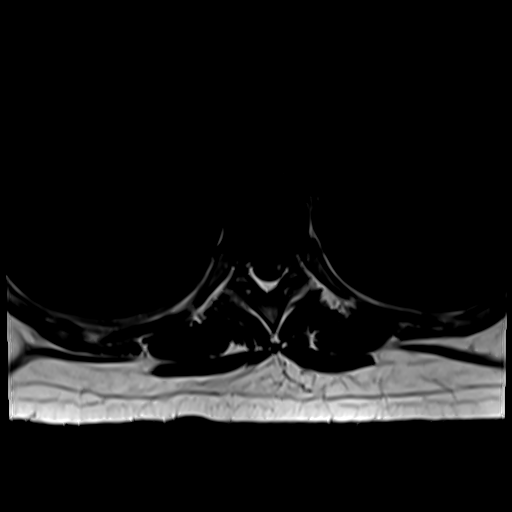
[im 22/39]
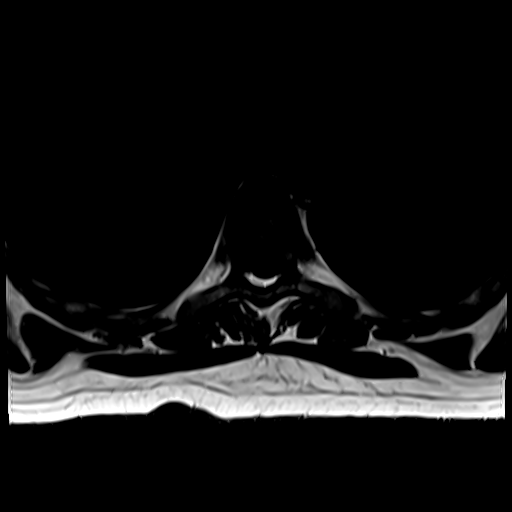
[im 28/39]
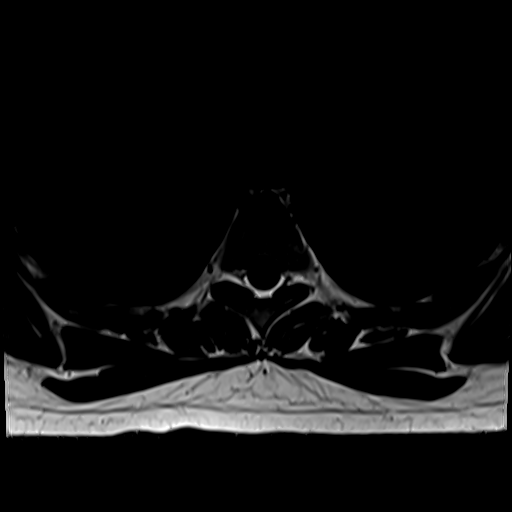
[im 33/39]
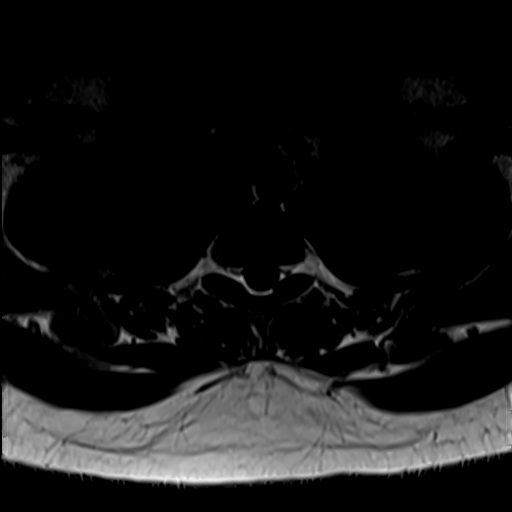
[im 39/39]
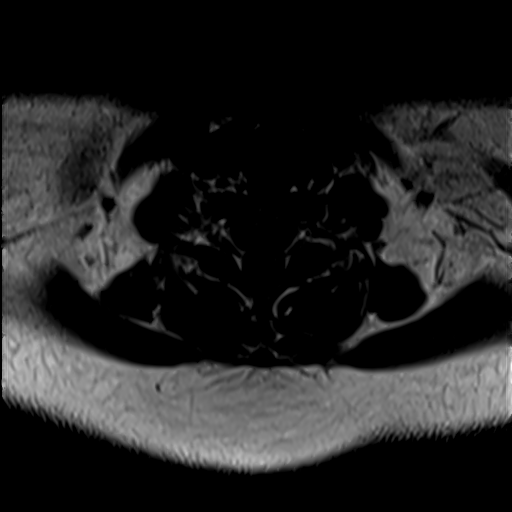

[Series 37: T1 fat-sat post-contrast · sagittal · 3.0mm · 1.33mm/px · 3 of 17 slices shown]
[im 1/17]
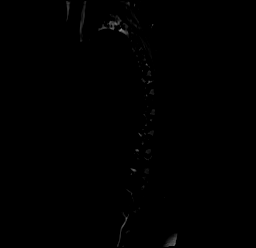
[im 9/17]
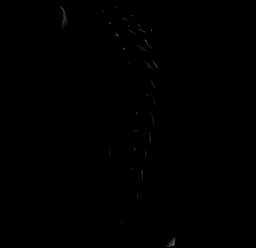
[im 17/17]
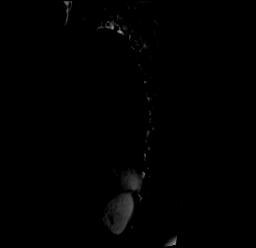

[30 of 48 positions shown; findings below may reference images not displayed]

FINDINGS: MRI HEAD FINDINGS

Brain: Cerebral volume within normal limits. Multiple scattered foci
of T2/FLAIR hyperintensity seen involving the periventricular, deep,
and subcortical white matter of both cerebral hemispheres, highly
suspicious for demyelinating disease. Several of these foci are
oriented perpendicular to the lateral ventricles. A dominant
subcortical lesion measuring 1.6 cm noted at the subcortical left
parietal lobe (series 12, image 15). At least 1 infratentorial
lesion seen at the left cerebellum. Following contrast
administration, patchy enhancement seen about several lesions, most
notable at the left parietal lobe (series 49, image 90 and right
frontal lobe (series 49, image 96).

No evidence for acute or subacute infarct. Gray-white matter
differentiation maintained. No encephalomalacia to suggest chronic
cortical infarction. No evidence for acute or chronic intracranial
hemorrhage.

No mass lesion, midline shift or mass effect. No hydrocephalus or
extra-axial fluid collection. Pituitary gland within normal limits
for age. Midline structures intact.

Vascular: Major intracranial vascular flow voids are maintained.

Skull and upper cervical spine: Craniocervical junction within
normal limits. Bone marrow signal intensity normal. No scalp soft
tissue abnormality.

Sinuses/Orbits: Globes orbital soft tissues within normal limits.
Small left maxillary sinus retention cyst. Paranasal sinuses are
otherwise clear. No significant mastoid effusion. Inner ear
structures grossly normal.

Other: None.

MRI CERVICAL SPINE FINDINGS

Alignment: Straightening of the normal cervical lordosis. No
listhesis.

Vertebrae: Vertebral body height well maintained without acute or
chronic fracture. Bone marrow signal intensity within normal limits.
No discrete or worrisome osseous lesions. No abnormal marrow edema
or enhancement.

Cord: Patchy signal abnormality seen at the medulla/craniocervical
junction (series 11, image 9). Additional subtle patchy signal
abnormality within the left dorsal cord at the level of C2-3 (series
11, image 9). Focal cord signal abnormality within the
central/dorsal cord at the level of C3-4 (series 11, image 8) patchy
cord signal abnormality at the dorsal cord at the level of C5
(series 11, image 8). Additional vague signal abnormality at the
level of C6. Findings consistent with demyelinating disease. No
abnormal enhancement to suggest active demyelination. Underlying
cord caliber within normal limits.

Posterior Fossa, vertebral arteries, paraspinal tissues: Negative.

Disc levels: No significant underlying disc pathology. No stenosis
or neural impingement.

MRI THORACIC SPINE FINDINGS

Alignment: Vertebral bodies normally aligned with preservation of
the normal thoracic kyphosis. No listhesis.

Vertebrae: Vertebral body height maintained without acute or chronic
fracture. Bone marrow signal intensity within normal limits. No
discrete or worrisome osseous lesions. No abnormal marrow edema or
enhancement.

Cord: No convincing cord signal abnormality seen within the thoracic
spine to suggest demyelinating disease. Underlying cord caliber
within normal limits. No abnormal enhancement.

Paraspinous soft tissues: Negative.

Disc levels: Minimal/early spondylosis noted at T5-6 and T6-7
without significant stenosis or impingement. Minimal facet
hypertrophy noted within the lower thoracic spine. No significant
stenosis or neural impingement.
IMPRESSION: MRI HEAD IMPRESSION:

1. Patchy multifocal signal abnormality involving both the
supratentorial and infratentorial cerebral white matter, compatible
with demyelinating disease/multiple sclerosis. Patchy post-contrast
enhancement about several lesions consistent with active
demyelination.
2. Otherwise normal brain MRI.

MRI CERVICAL SPINE IMPRESSION:

1. Patchy multifocal signal abnormality throughout the cervical
spinal cord extending from the brainstem through C6, consistent with
demyelinating disease/multiple sclerosis. No evidence for active
demyelination within the cervical spine.
2. Otherwise normal MRI of the cervical spine.

MRI THORACIC SPINE IMPRESSION:

Negative MRI of the thoracic spine. No convincing cord signal
changes to suggest involvement with demyelinating disease. No
abnormal enhancement.

## 2020-02-02 IMAGING — MR MR CERVICAL SPINE WO/W CM
3 series · 24 of 48 positions shown · IV contrast (gadavist)
Comparison: Prior head CT from earlier the same day.

EXAM:
MRI HEAD WITHOUT AND WITH CONTRAST

MRI CERVICAL SPINE WITHOUT AND WITH CONTRAST
MRI THORACIC SPINE WITHOUT AND WITH CONTRAST
TECHNIQUE: Multiplanar, multiecho pulse sequences of the brain and surrounding
structures, and cervical spine, to include the craniocervical
junction and cervicothoracic junction, and thoracic spine were
obtained without and with intravenous contrast.
CONTRAST:  9mL GADAVIST GADOBUTROL 1 MMOL/ML IV SOLN

[Series 9: T2 · sagittal · 3.0mm · 0.86mm/px · 11 of 15 slices shown]
[im 1/15]
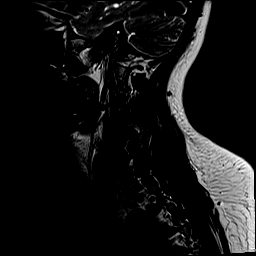
[im 2/15]
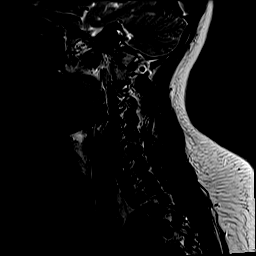
[im 3/15]
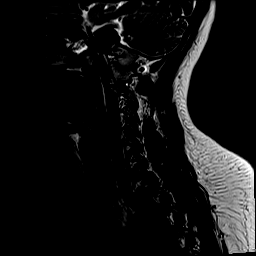
[im 5/15]
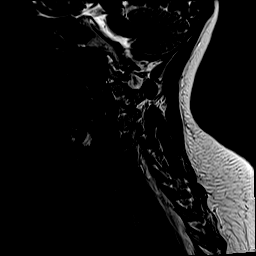
[im 6/15]
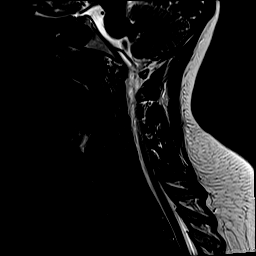
[im 8/15]
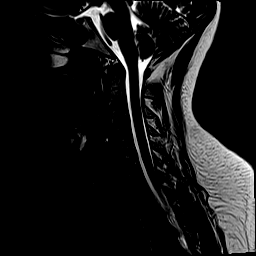
[im 9/15]
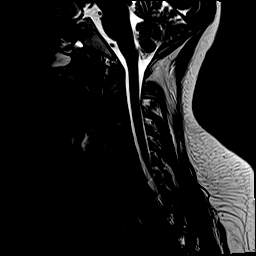
[im 10/15]
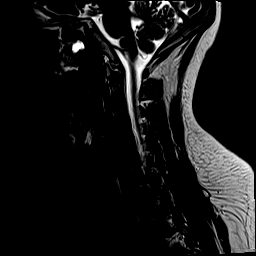
[im 12/15]
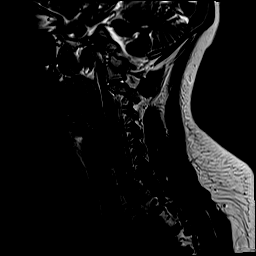
[im 13/15]
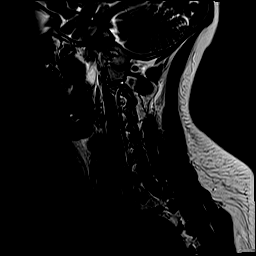
[im 15/15]
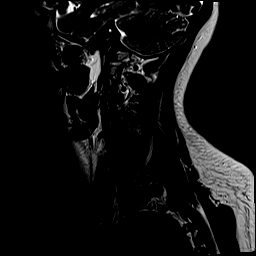

[Series 42: T1 fat-sat post-contrast · sagittal · 3.0mm · 0.43mm/px · 8 of 15 slices shown]
[im 1/15]
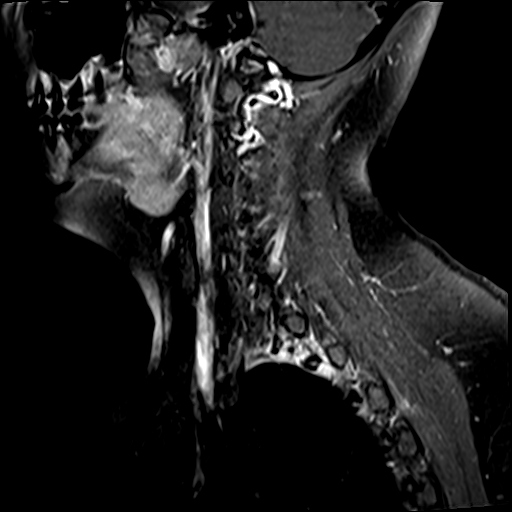
[im 2/15]
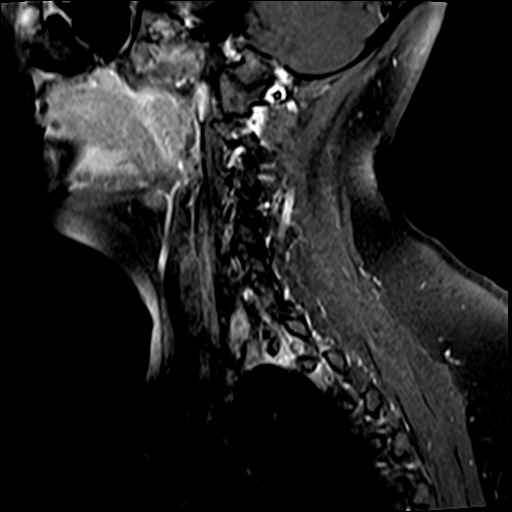
[im 5/15]
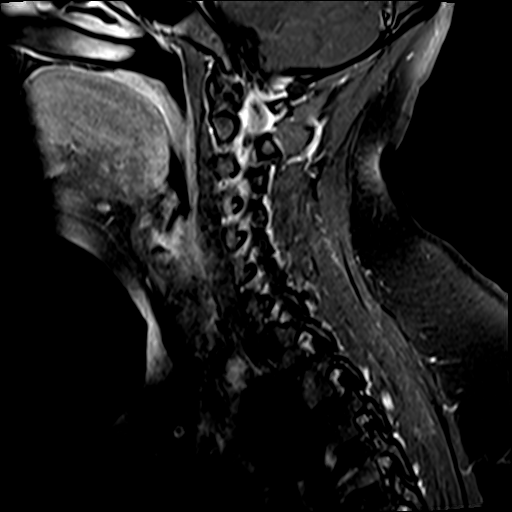
[im 7/15]
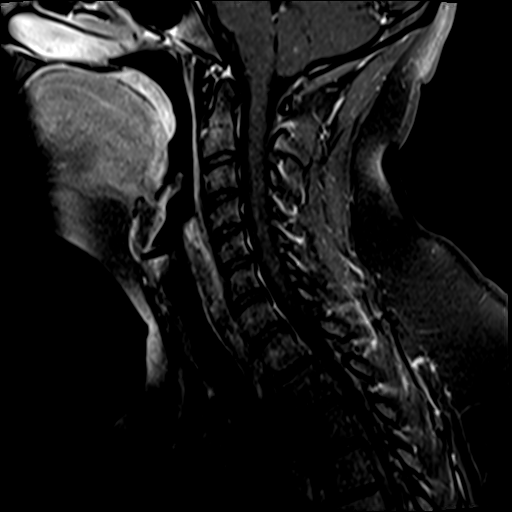
[im 8/15]
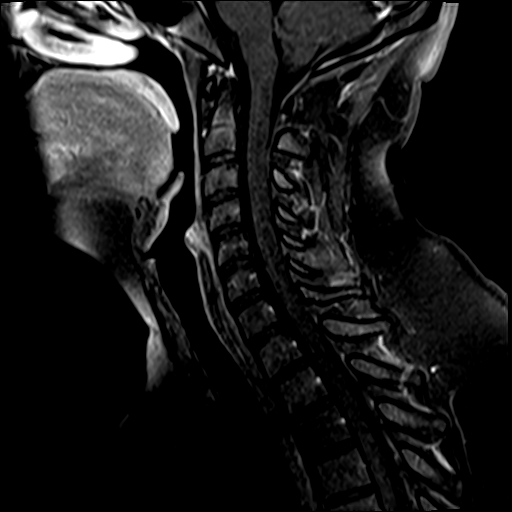
[im 10/15]
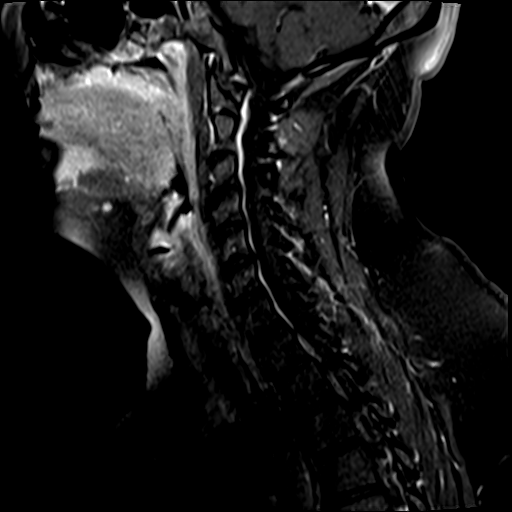
[im 13/15]
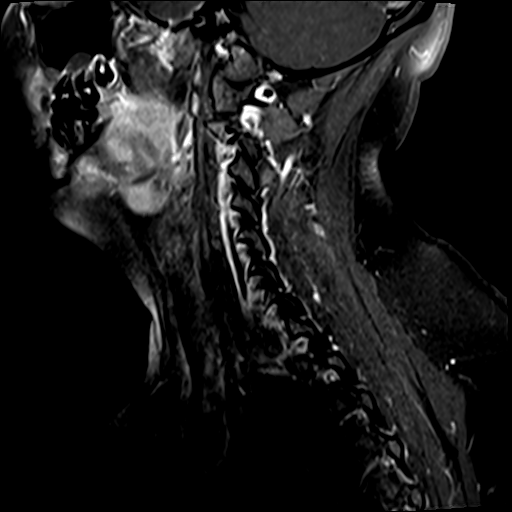
[im 15/15]
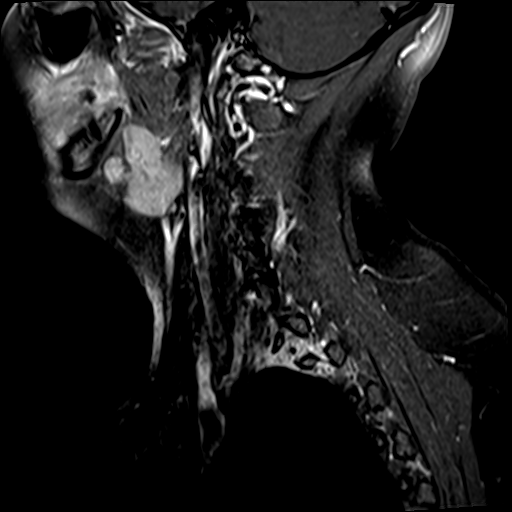

[Series 43: T1 post-contrast · axial · 3.0mm · 0.39mm/px · z∈[-169,-67]mm · 5 of 40 slices shown]
[im 2/40]
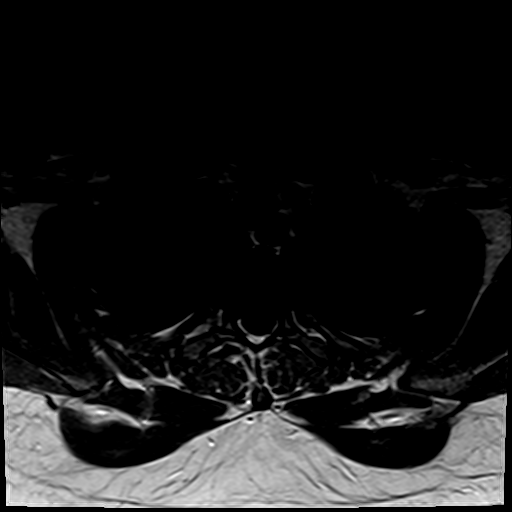
[im 7/40]
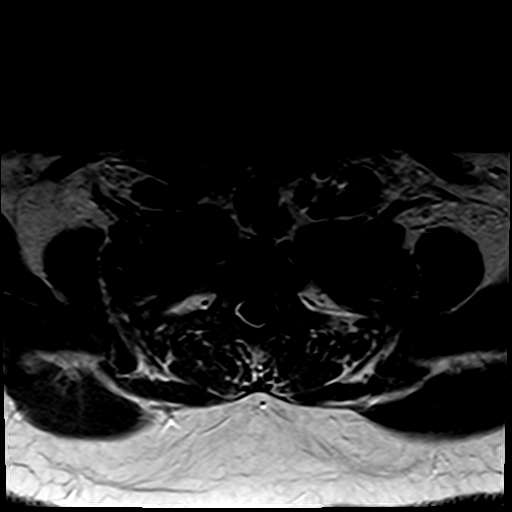
[im 8/40]
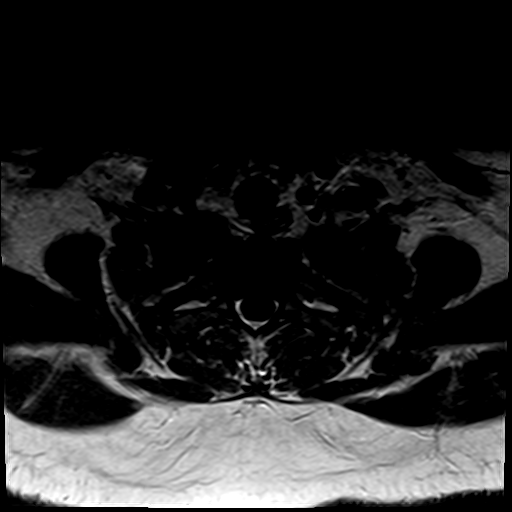
[im 20/40]
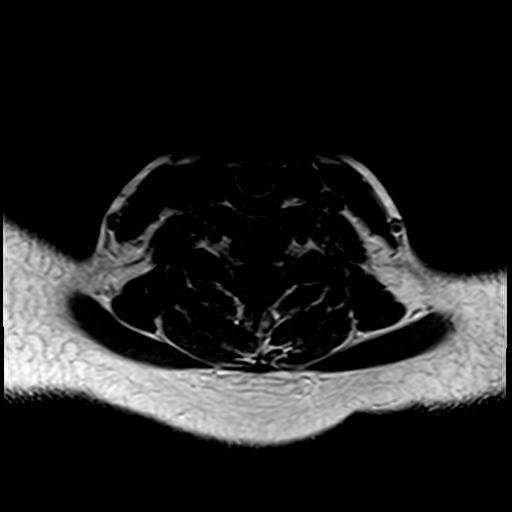
[im 34/40]
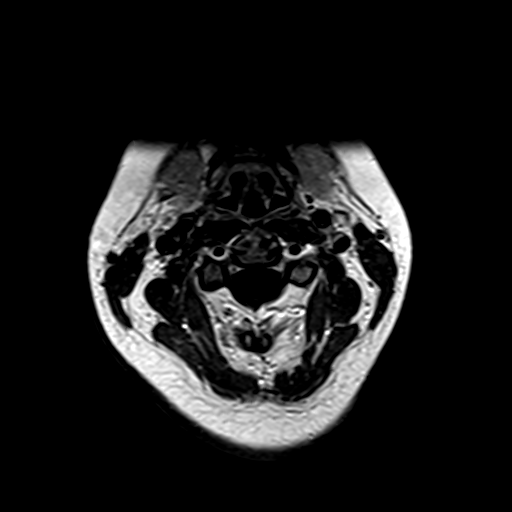

[24 of 48 positions shown; findings below may reference images not displayed]

FINDINGS: MRI HEAD FINDINGS

Brain: Cerebral volume within normal limits. Multiple scattered foci
of T2/FLAIR hyperintensity seen involving the periventricular, deep,
and subcortical white matter of both cerebral hemispheres, highly
suspicious for demyelinating disease. Several of these foci are
oriented perpendicular to the lateral ventricles. A dominant
subcortical lesion measuring 1.6 cm noted at the subcortical left
parietal lobe (series 12, image 15). At least 1 infratentorial
lesion seen at the left cerebellum. Following contrast
administration, patchy enhancement seen about several lesions, most
notable at the left parietal lobe (series 49, image 90 and right
frontal lobe (series 49, image 96).

No evidence for acute or subacute infarct. Gray-white matter
differentiation maintained. No encephalomalacia to suggest chronic
cortical infarction. No evidence for acute or chronic intracranial
hemorrhage.

No mass lesion, midline shift or mass effect. No hydrocephalus or
extra-axial fluid collection. Pituitary gland within normal limits
for age. Midline structures intact.

Vascular: Major intracranial vascular flow voids are maintained.

Skull and upper cervical spine: Craniocervical junction within
normal limits. Bone marrow signal intensity normal. No scalp soft
tissue abnormality.

Sinuses/Orbits: Globes orbital soft tissues within normal limits.
Small left maxillary sinus retention cyst. Paranasal sinuses are
otherwise clear. No significant mastoid effusion. Inner ear
structures grossly normal.

Other: None.

MRI CERVICAL SPINE FINDINGS

Alignment: Straightening of the normal cervical lordosis. No
listhesis.

Vertebrae: Vertebral body height well maintained without acute or
chronic fracture. Bone marrow signal intensity within normal limits.
No discrete or worrisome osseous lesions. No abnormal marrow edema
or enhancement.

Cord: Patchy signal abnormality seen at the medulla/craniocervical
junction (series 11, image 9). Additional subtle patchy signal
abnormality within the left dorsal cord at the level of C2-3 (series
11, image 9). Focal cord signal abnormality within the
central/dorsal cord at the level of C3-4 (series 11, image 8) patchy
cord signal abnormality at the dorsal cord at the level of C5
(series 11, image 8). Additional vague signal abnormality at the
level of C6. Findings consistent with demyelinating disease. No
abnormal enhancement to suggest active demyelination. Underlying
cord caliber within normal limits.

Posterior Fossa, vertebral arteries, paraspinal tissues: Negative.

Disc levels: No significant underlying disc pathology. No stenosis
or neural impingement.

MRI THORACIC SPINE FINDINGS

Alignment: Vertebral bodies normally aligned with preservation of
the normal thoracic kyphosis. No listhesis.

Vertebrae: Vertebral body height maintained without acute or chronic
fracture. Bone marrow signal intensity within normal limits. No
discrete or worrisome osseous lesions. No abnormal marrow edema or
enhancement.

Cord: No convincing cord signal abnormality seen within the thoracic
spine to suggest demyelinating disease. Underlying cord caliber
within normal limits. No abnormal enhancement.

Paraspinous soft tissues: Negative.

Disc levels: Minimal/early spondylosis noted at T5-6 and T6-7
without significant stenosis or impingement. Minimal facet
hypertrophy noted within the lower thoracic spine. No significant
stenosis or neural impingement.
IMPRESSION: MRI HEAD IMPRESSION:

1. Patchy multifocal signal abnormality involving both the
supratentorial and infratentorial cerebral white matter, compatible
with demyelinating disease/multiple sclerosis. Patchy post-contrast
enhancement about several lesions consistent with active
demyelination.
2. Otherwise normal brain MRI.

MRI CERVICAL SPINE IMPRESSION:

1. Patchy multifocal signal abnormality throughout the cervical
spinal cord extending from the brainstem through C6, consistent with
demyelinating disease/multiple sclerosis. No evidence for active
demyelination within the cervical spine.
2. Otherwise normal MRI of the cervical spine.

MRI THORACIC SPINE IMPRESSION:

Negative MRI of the thoracic spine. No convincing cord signal
changes to suggest involvement with demyelinating disease. No
abnormal enhancement.

## 2020-02-02 MED ORDER — DEXAMETHASONE SODIUM PHOSPHATE 4 MG/ML IJ SOLN
4.0000 mg | Freq: Once | INTRAMUSCULAR | Status: AC
  Administered 2020-02-02: 4 mg via INTRAVENOUS
  Filled 2020-02-02: qty 1

## 2020-02-02 MED ORDER — GADOBUTROL 1 MMOL/ML IV SOLN
9.0000 mL | Freq: Once | INTRAVENOUS | Status: AC | PRN
  Administered 2020-02-02: 9 mL via INTRAVENOUS

## 2020-02-02 MED ORDER — SODIUM CHLORIDE 0.9 % IV BOLUS
1000.0000 mL | Freq: Once | INTRAVENOUS | Status: AC
  Administered 2020-02-02: 1000 mL via INTRAVENOUS

## 2020-02-02 MED ORDER — METOCLOPRAMIDE HCL 5 MG/ML IJ SOLN
10.0000 mg | Freq: Once | INTRAMUSCULAR | Status: AC
  Administered 2020-02-02: 10 mg via INTRAVENOUS
  Filled 2020-02-02: qty 2

## 2020-02-02 MED ORDER — DIPHENHYDRAMINE HCL 50 MG/ML IJ SOLN
25.0000 mg | Freq: Once | INTRAMUSCULAR | Status: AC
  Administered 2020-02-02: 25 mg via INTRAVENOUS
  Filled 2020-02-02: qty 1

## 2020-02-02 NOTE — ED Notes (Signed)
Patient ambulated to CT with steady gait and returned at this time

## 2020-02-02 NOTE — ED Provider Notes (Signed)
[facility] EMERGENCY DEPARTMENT Provider Note   CSN: [id number] Arrival date & time: 02/02/20  1533     History Chief Complaint  Patient presents with  . Migraine    Tami Mcpherson is a 19 y.o. 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  Patient states that for the last 3 to 4 days, she has been having blurry vision to the right eye associated with some headaches. She also has some nausea vomiting as well.  She took several doses of Excedrin with no relief.  She saw [facility] neurology about 2 months ago and was recommended to have an MRI for her hemiplegic migraines but she states that she did not get it due to financial reasons.  She states that she has no weakness currently associated with her headaches.  However the blurry vision is unusual for her.  She denies any trouble speaking.  The history is provided by the patient.       Past Medical History:  Diagnosis Date  . Migraine     There are no problems to display for this patient.   Past Surgical History:  Procedure Laterality Date  . TYMPANOSTOMY TUBE PLACEMENT       OB History   No obstetric history on file.     History reviewed. No pertinent family history.  Social History   Tobacco Use  . Smoking status: Never Smoker  . Smokeless tobacco: Never Used  Vaping Use  . Vaping Use: Never used  Substance Use Topics  . Alcohol use: No  . Drug use: No    Home Medications Prior to Admission medications   Not on File    Allergies    Patient has no known allergies.  Review of Systems   Review of Systems  Neurological: Positive for headaches.  All other systems reviewed and are negative.   Physical Exam Updated Vital Signs BP 140/84 (BP Location: Right Arm)   Pulse 63   Temp 99 F (37.2 C) (Oral)   Resp 16   Ht 5\' 8"  (1.727 m)   Wt 90.7 kg   LMP 01/26/2020   SpO2 100%   BMI 30.41 kg/m   Physical Exam Vitals and nursing  note reviewed.  Constitutional:      Appearance: Normal appearance.     Comments: Uncomfortable, photophobic   HENT:     Head: Normocephalic.     Right Ear: Tympanic membrane normal.     Left Ear: Tympanic membrane normal.     Nose: Nose normal.     Mouth/Throat:     Mouth: Mucous membranes are moist.  Eyes:     Extraocular Movements: Extraocular movements intact.     Comments: Extraocular movements intact, no obvious visual field cut.  Cardiovascular:     Rate and Rhythm: Normal rate and regular rhythm.     Pulses: Normal pulses.     Heart sounds: Normal heart sounds.  Pulmonary:     Effort: Pulmonary effort is normal.     Breath sounds: Normal breath sounds.  Abdominal:     General: Abdomen is flat.     Palpations: Abdomen is soft.  Musculoskeletal:        General: Normal range of motion.     Cervical back: Normal range of motion.  Skin:    General: Skin is warm.     Capillary Refill: Capillary refill takes less than 2 seconds.  Neurological:     General: No  focal deficit present.     Mental Status: She is alert and oriented to person, place, and time.     Comments: No obvious facial droop and normal sensation of the face and arms and legs.  Patient does have normal finger-to-nose bilaterally and normal strength in bilateral arms and legs.  Patient has no pronator drift and has normal steady gait  Psychiatric:        Mood and Affect: Mood normal.     ED Results / Procedures / Treatments   Labs (all labs ordered are listed, but only abnormal results are displayed) Labs Reviewed  CBC WITH DIFFERENTIAL/PLATELET  BASIC METABOLIC PANEL  PREGNANCY, URINE    EKG None  Radiology CT Head Wo Contrast  Result Date: 02/02/2020 CLINICAL DATA:  Headache, migraine without history of trauma. Nausea and vomiting this morning. EXAM: CT HEAD WITHOUT CONTRAST TECHNIQUE: Contiguous axial images were obtained from the base of the skull through the vertex without intravenous  contrast. COMPARISON:  Oct 07, 2016 FINDINGS: Brain: Vague area of hypodensity in the posterior LEFT parietal lobe. This is at the gray-white junction with some extension towards the cortex but without frank involvement of the cortex. No intracranial hemorrhage. No mass effect. No midline shift. No abnormal extra-axial fluid collection. Vascular: No hyperdense vessel or unexpected calcification. Skull: Normal. Negative for fracture or focal lesion. Sinuses/Orbits: Sinuses are incompletely imaged without acute abnormality. Orbits without acute process. Other: None IMPRESSION: 1. Vague area of hypoattenuation in subcortical white matter of the LEFT parietal lobe is of uncertain significance, suspicious for recent ischemia, underlying mass lesion or demyelinating process among other differential considerations. Brain MRI is suggested for further evaluation. These results were called by telephone at the time of interpretation on 02/02/2020 at 7:53 pm to provider Jinnifer Montejano , who verbally acknowledged these results. Electronically Signed   By: Donzetta Kohut M.D.   On: 02/02/2020 19:50    Procedures Procedures (including critical care time)  Medications Ordered in ED Medications  sodium chloride 0.9 % bolus 1,000 mL (has no administration in time range)  metoCLOPramide (REGLAN) injection 10 mg (has no administration in time range)  diphenhydrAMINE (BENADRYL) injection 25 mg (has no administration in time range)  dexamethasone (DECADRON) injection 4 mg (has no administration in time range)    ED Course  I have reviewed the triage vital signs and the nursing notes.  Pertinent labs & imaging results that were available during my care of the patient were reviewed by me and considered in my medical decision making (see chart for details).    MDM Rules/Calculators/A&P                         Tami Mcpherson is a 19 y.o. female here presenting with headaches and right eye blurry vision.  She has history of  hemiplegic migraines but this is different in her usual headaches.  Given that this is new onset headaches, will get a CT head to rule out mass.  We will also give migraine cocktail   8:20 PM CT showed possible brain mass vs demyelinating disease. Ordered MRI brain w/wo contrast. Talked to Dr. Clayborne Dana in the ED at [facility], who will be the accepting doctor.    Final Clinical Impression(s) / ED Diagnoses Final diagnoses:  None    Rx / DC Orders ED Discharge Orders    None       Charlynne Pander, MD 02/02/20 2022

## 2020-02-02 NOTE — ED Triage Notes (Signed)
Migraine x several days with right eye vision changes. Hx of migraines-reports this feels similar. Denies head trauma. Nausea and vomiting this morning, denies at this time. Tylenol at 11am today. PERRLA

## 2020-02-02 NOTE — ED Triage Notes (Signed)
Pt transferred to [facility] from [facility]. Pt c/o migraine, see previous triage note.

## 2020-02-02 NOTE — ED Notes (Signed)
Tami Mcpherson father [phone] would like an update on the pt when one is available

## 2020-02-03 ENCOUNTER — Encounter (HOSPITAL_COMMUNITY): Payer: Self-pay | Admitting: Internal Medicine

## 2020-02-03 DIAGNOSIS — G379 Demyelinating disease of central nervous system, unspecified: Secondary | ICD-10-CM | POA: Diagnosis present

## 2020-02-03 DIAGNOSIS — G35 Multiple sclerosis: Secondary | ICD-10-CM | POA: Diagnosis present

## 2020-02-03 DIAGNOSIS — G43409 Hemiplegic migraine, not intractable, without status migrainosus: Secondary | ICD-10-CM | POA: Diagnosis present

## 2020-02-03 DIAGNOSIS — Z20822 Contact with and (suspected) exposure to covid-19: Secondary | ICD-10-CM | POA: Diagnosis present

## 2020-02-03 DIAGNOSIS — R112 Nausea with vomiting, unspecified: Secondary | ICD-10-CM | POA: Diagnosis present

## 2020-02-03 DIAGNOSIS — Z8249 Family history of ischemic heart disease and other diseases of the circulatory system: Secondary | ICD-10-CM | POA: Diagnosis not present

## 2020-02-03 DIAGNOSIS — Z833 Family history of diabetes mellitus: Secondary | ICD-10-CM | POA: Diagnosis not present

## 2020-02-03 DIAGNOSIS — E860 Dehydration: Secondary | ICD-10-CM | POA: Diagnosis not present

## 2020-02-03 LAB — BASIC METABOLIC PANEL
Anion gap: 13 (ref 5–15)
BUN: 7 mg/dL (ref 6–20)
CO2: 18 mmol/L — ABNORMAL LOW (ref 22–32)
Calcium: 9.6 mg/dL (ref 8.9–10.3)
Chloride: 107 mmol/L (ref 98–111)
Creatinine, Ser: 0.67 mg/dL (ref 0.44–1.00)
GFR calc Af Amer: >60 mL/min (ref >60)
GFR calc non Af Amer: >60 mL/min (ref >60)
Glucose, Bld: 136 mg/dL — ABNORMAL HIGH (ref 70–99)
Potassium: 4.1 mmol/L (ref 3.5–5.1)
Sodium: 138 mmol/L (ref 135–145)

## 2020-02-03 LAB — CBC
HCT: 42.7 % (ref 36.0–46.0)
Hemoglobin: 14.3 g/dL (ref 12.0–15.0)
MCH: 29.5 pg (ref 26.0–34.0)
MCHC: 33.5 g/dL (ref 30.0–36.0)
MCV: 88 fL (ref 80.0–100.0)
Platelets: 263 10*3/uL (ref 150–400)
RBC: 4.85 MIL/uL (ref 3.87–5.11)
RDW: 12 % (ref 11.5–15.5)
WBC: 7.7 10*3/uL (ref 4.0–10.5)
nRBC: 0 % (ref 0.0–0.2)

## 2020-02-03 LAB — SARS CORONAVIRUS 2 BY RT PCR (HOSPITAL ORDER, PERFORMED IN ~~LOC~~ HOSPITAL LAB): SARS Coronavirus 2: NEGATIVE

## 2020-02-03 LAB — GLUCOSE, CAPILLARY
Glucose-Capillary: 242 mg/dL — ABNORMAL HIGH (ref 70–99)
Glucose-Capillary: 153 mg/dL — ABNORMAL HIGH (ref 70–99)

## 2020-02-03 LAB — CBG MONITORING, ED
Glucose-Capillary: 99 mg/dL (ref 70–99)
Glucose-Capillary: 262 mg/dL — ABNORMAL HIGH (ref 70–99)

## 2020-02-03 LAB — C-REACTIVE PROTEIN: CRP: <0.5 mg/dL (ref <1.0)

## 2020-02-03 LAB — HIV ANTIBODY (ROUTINE TESTING W REFLEX): HIV Screen 4th Generation wRfx: NONREACTIVE

## 2020-02-03 LAB — SEDIMENTATION RATE: Sed Rate: 2 mm/hr (ref 0–22)

## 2020-02-03 MED ORDER — ONDANSETRON HCL 4 MG/2ML IJ SOLN: 4.0000 mg | Freq: Four times a day (QID) | INTRAMUSCULAR | Status: DC | PRN

## 2020-02-03 MED ORDER — INSULIN ASPART 100 UNIT/ML ~~LOC~~ SOLN
0.0000 [IU] | Freq: Three times a day (TID) | SUBCUTANEOUS | Status: DC
  Administered 2020-02-03: 5 [IU] via SUBCUTANEOUS
  Administered 2020-02-03: 3 [IU] via SUBCUTANEOUS
  Administered 2020-02-04: 1 [IU] via SUBCUTANEOUS

## 2020-02-03 MED ORDER — KETOROLAC TROMETHAMINE 15 MG/ML IJ SOLN
15.0000 mg | Freq: Four times a day (QID) | INTRAMUSCULAR | Status: DC | PRN
  Administered 2020-02-03: 15 mg via INTRAVENOUS
  Filled 2020-02-03: qty 1

## 2020-02-03 MED ORDER — SODIUM CHLORIDE 0.9 % IV SOLN
1000.0000 mg | Freq: Every day | INTRAVENOUS | Status: DC
  Administered 2020-02-04: 1000 mg via INTRAVENOUS
  Filled 2020-02-03: qty 8

## 2020-02-03 MED ORDER — SODIUM CHLORIDE 0.9 % IV SOLN
1000.0000 mg | Freq: Once | INTRAVENOUS | Status: AC
  Administered 2020-02-03: 06:00:00 1000 mg via INTRAVENOUS
  Filled 2020-02-03: qty 8

## 2020-02-03 MED ORDER — SODIUM CHLORIDE 0.9 % IV SOLN
1000.0000 mg | Freq: Once | INTRAVENOUS | Status: DC
  Filled 2020-02-03: qty 8

## 2020-02-03 MED ORDER — ACETAMINOPHEN 325 MG PO TABS
650.0000 mg | ORAL_TABLET | Freq: Four times a day (QID) | ORAL | Status: DC | PRN
  Administered 2020-02-04: 650 mg via ORAL
  Filled 2020-02-03: qty 2

## 2020-02-03 MED ORDER — SODIUM CHLORIDE 0.9 % IV SOLN: 1000.0000 mg | Freq: Every day | INTRAVENOUS | Status: DC

## 2020-02-03 MED ORDER — ONDANSETRON HCL 4 MG PO TABS: 4.0000 mg | ORAL_TABLET | Freq: Four times a day (QID) | ORAL | Status: DC | PRN

## 2020-02-03 MED ORDER — PANTOPRAZOLE SODIUM 40 MG PO TBEC
40.0000 mg | DELAYED_RELEASE_TABLET | Freq: Every day | ORAL | Status: DC
  Administered 2020-02-03 – 2020-02-04 (×2): 40 mg via ORAL
  Filled 2020-02-03 (×2): qty 1

## 2020-02-03 NOTE — ED Notes (Signed)
Provider at bedside. This RN at bedside for blood draw.

## 2020-02-03 NOTE — ED Notes (Signed)
Pt transferred to hospital bed for comfort.

## 2020-02-03 NOTE — H&P (Signed)
History and Physical    Tami Mcpherson XIP:[id number] DOB: Dec 11, 2000 DOA: 02/02/2020  PCP: Lester Groom., MD  Patient coming from: Home.  Chief Complaint: Right of liver lesion and pain behind eyes.  HPI: Tami Mcpherson is a 19 y.o. female with history of migraine has not had any migraine attacks for more than 2 years who has been recently experiencing right upper and lower extremity numbness off and on had seen neurologist in July 2021 at that time was recommended to get MRI brain and C-spine which patient was reluctant to get as per the report started experiencing right IV retro-orbital pain with nausea vomiting initially with progressive blurriness of the vision.  Denies any weakness of upper or lower extremities.  Denies any incontinence of urine or bowel.  ED Course: In the ER CT head shows possible mass versus demyelinating disease and patient was transferred [facility] to [facility].  At [facility] MRI brain C-spine and T-spine were done which shows demyelinating lesions concerning for multiple sclerosis.  On-call neurologist was consulted patient started on IV steroids and admitted for further observation.  Labs are largely unremarkable Covid test negative patient has not had Covid 19 vaccination previously.  Review of Systems: As per HPI, rest all negative.   Past Medical History:  Diagnosis Date  . Migraine     Past Surgical History:  Procedure Laterality Date  . TYMPANOSTOMY TUBE PLACEMENT       reports that she has never smoked. She has never used smokeless tobacco. She reports that she does not drink alcohol and does not use drugs.  No Known Allergies  Family History  Family history unknown: Yes    Prior to Admission medications   Not on File    Physical Exam: Constitutional: Moderately built and nourished. Vitals:   02/02/20 1956 02/02/20 2100 02/02/20 2137 02/03/20 0030  BP: 140/84 99/83 133/62 124/71  Pulse: 63 70 (!)  58 61  Resp: 16 16 16 14   Temp: 99 F (37.2 C) 98.7 F (37.1 C) 98.5 F (36.9 C) 98.8 F (37.1 C)  TempSrc: Oral Oral Oral Oral  SpO2: 100% 100% 98% 100%  Weight:      Height:       Eyes: Anicteric no pallor. ENMT: No discharge from the ears eyes nose or mouth. Neck: No mass felt.  No neck rigidity. Respiratory: No rhonchi or crepitations. Cardiovascular: S1-S2 heard. Abdomen: Soft nontender bowel sound present. Musculoskeletal: No edema. Skin: No rash. Neurologic: Alert awake oriented to time place and person.  Has poor vision in the right eye.  Has paradoxical right eye pupillary dilatation.  Moves all extremities 5 x 5. Psychiatric: Appears normal.  Normal affect.   Labs on Admission: I have personally reviewed following labs and imaging studies  CBC: Recent Labs  Lab 02/02/20 1938  WBC 7.2  NEUTROABS 3.7  HGB 13.8  HCT 41.8  MCV 87.6  PLT 258   Basic Metabolic Panel: Recent Labs  Lab 02/02/20 1938  NA 141  K 4.0  CL 105  CO2 25  GLUCOSE 93  BUN 7  CREATININE 0.66  CALCIUM 9.5   GFR: Estimated Creatinine Clearance: 132.1 mL/min (by C-G formula based on SCr of 0.66 mg/dL). Liver Function Tests: No results for input(s): AST, ALT, ALKPHOS, BILITOT, PROT, ALBUMIN in the last 168 hours. No results for input(s): LIPASE, AMYLASE in the last 168 hours. No results for input(s): AMMONIA in the last 168 hours. Coagulation  Profile: No results for input(s): INR, PROTIME in the last 168 hours. Cardiac Enzymes: No results for input(s): CKTOTAL, CKMB, CKMBINDEX, TROPONINI in the last 168 hours. BNP (last 3 results) No results for input(s): PROBNP in the last 8760 hours. HbA1C: No results for input(s): HGBA1C in the last 72 hours. CBG: No results for input(s): GLUCAP in the last 168 hours. Lipid Profile: No results for input(s): CHOL, HDL, LDLCALC, TRIG, CHOLHDL, LDLDIRECT in the last 72 hours. Thyroid Function Tests: No results for input(s): TSH, T4TOTAL,  FREET4, T3FREE, THYROIDAB in the last 72 hours. Anemia Panel: No results for input(s): VITAMINB12, FOLATE, FERRITIN, TIBC, IRON, RETICCTPCT in the last 72 hours. Urine analysis: No results found for: COLORURINE, APPEARANCEUR, LABSPEC, PHURINE, GLUCOSEU, HGBUR, BILIRUBINUR, KETONESUR, PROTEINUR, UROBILINOGEN, NITRITE, LEUKOCYTESUR Sepsis Labs: @LABRCNTIP (procalcitonin:4,lacticidven:4) )No results found for this or any previous visit (from the past 240 hour(s)).   Radiological Exams on Admission: CT Head Wo Contrast  Result Date: 02/02/2020 CLINICAL DATA:  Headache, migraine without history of trauma. Nausea and vomiting this morning. EXAM: CT HEAD WITHOUT CONTRAST TECHNIQUE: Contiguous axial images were obtained from the base of the skull through the vertex without intravenous contrast. COMPARISON:  Oct 07, 2016 FINDINGS: Brain: Vague area of hypodensity in the posterior LEFT parietal lobe. This is at the gray-white junction with some extension towards the cortex but without frank involvement of the cortex. No intracranial hemorrhage. No mass effect. No midline shift. No abnormal extra-axial fluid collection. Vascular: No hyperdense vessel or unexpected calcification. Skull: Normal. Negative for fracture or focal lesion. Sinuses/Orbits: Sinuses are incompletely imaged without acute abnormality. Orbits without acute process. Other: None IMPRESSION: 1. Vague area of hypoattenuation in subcortical white matter of the LEFT parietal lobe is of uncertain significance, suspicious for recent ischemia, underlying mass lesion or demyelinating process among other differential considerations. Brain MRI is suggested for further evaluation. These results were called by telephone at the time of interpretation on 02/02/2020 at 7:53 pm to provider DAVID YAO , who verbally acknowledged these results. Electronically Signed   By: 04/03/2020 M.D.   On: 02/02/2020 19:50   MR Brain W and Wo Contrast  Result Date:  02/03/2020 EXAM: MRI HEAD WITHOUT AND WITH CONTRAST MRI CERVICAL SPINE WITHOUT AND WITH CONTRAST MRI THORACIC SPINE WITHOUT AND WITH CONTRAST TECHNIQUE: Multiplanar, multiecho pulse sequences of the brain and surrounding structures, and cervical spine, to include the craniocervical junction and cervicothoracic junction, and thoracic spine were obtained without and with intravenous contrast. CONTRAST:  15mL GADAVIST GADOBUTROL 1 MMOL/ML IV SOLN COMPARISON:  Prior head CT from earlier the same day. FINDINGS: MRI HEAD FINDINGS Brain: Cerebral volume within normal limits. Multiple scattered foci of T2/FLAIR hyperintensity seen involving the periventricular, deep, and subcortical white matter of both cerebral hemispheres, highly suspicious for demyelinating disease. Several of these foci are oriented perpendicular to the lateral ventricles. A dominant subcortical lesion measuring 1.6 cm noted at the subcortical left parietal lobe (series 12, image 15). At least 1 infratentorial lesion seen at the left cerebellum. Following contrast administration, patchy enhancement seen about several lesions, most notable at the left parietal lobe (series 49, image 90 and right frontal lobe (series 49, image 96). No evidence for acute or subacute infarct. Gray-white matter differentiation maintained. No encephalomalacia to suggest chronic cortical infarction. No evidence for acute or chronic intracranial hemorrhage. No mass lesion, midline shift or mass effect. No hydrocephalus or extra-axial fluid collection. Pituitary gland within normal limits for age. Midline structures intact. Vascular: Major intracranial vascular  flow voids are maintained. Skull and upper cervical spine: Craniocervical junction within normal limits. Bone marrow signal intensity normal. No scalp soft tissue abnormality. Sinuses/Orbits: Globes orbital soft tissues within normal limits. Small left maxillary sinus retention cyst. Paranasal sinuses are otherwise clear.  No significant mastoid effusion. Inner ear structures grossly normal. Other: None. MRI CERVICAL SPINE FINDINGS Alignment: Straightening of the normal cervical lordosis. No listhesis. Vertebrae: Vertebral body height well maintained without acute or chronic fracture. Bone marrow signal intensity within normal limits. No discrete or worrisome osseous lesions. No abnormal marrow edema or enhancement. Cord: Patchy signal abnormality seen at the medulla/craniocervical junction (series 11, image 9). Additional subtle patchy signal abnormality within the left dorsal cord at the level of C2-3 (series 11, image 9). Focal cord signal abnormality within the central/dorsal cord at the level of C3-4 (series 11, image 8) patchy cord signal abnormality at the dorsal cord at the level of C5 (series 11, image 8). Additional vague signal abnormality at the level of C6. Findings consistent with demyelinating disease. No abnormal enhancement to suggest active demyelination. Underlying cord caliber within normal limits. Posterior Fossa, vertebral arteries, paraspinal tissues: Negative. Disc levels: No significant underlying disc pathology. No stenosis or neural impingement. MRI THORACIC SPINE FINDINGS Alignment: Vertebral bodies normally aligned with preservation of the normal thoracic kyphosis. No listhesis. Vertebrae: Vertebral body height maintained without acute or chronic fracture. Bone marrow signal intensity within normal limits. No discrete or worrisome osseous lesions. No abnormal marrow edema or enhancement. Cord: No convincing cord signal abnormality seen within the thoracic spine to suggest demyelinating disease. Underlying cord caliber within normal limits. No abnormal enhancement. Paraspinous soft tissues: Negative. Disc levels: Minimal/early spondylosis noted at T5-6 and T6-7 without significant stenosis or impingement. Minimal facet hypertrophy noted within the lower thoracic spine. No significant stenosis or neural  impingement. IMPRESSION: MRI HEAD IMPRESSION: 1. Patchy multifocal signal abnormality involving both the supratentorial and infratentorial cerebral white matter, compatible with demyelinating disease/multiple sclerosis. Patchy post-contrast enhancement about several lesions consistent with active demyelination. 2. Otherwise normal brain MRI. MRI CERVICAL SPINE IMPRESSION: 1. Patchy multifocal signal abnormality throughout the cervical spinal cord extending from the brainstem through C6, consistent with demyelinating disease/multiple sclerosis. No evidence for active demyelination within the cervical spine. 2. Otherwise normal MRI of the cervical spine. MRI THORACIC SPINE IMPRESSION: Negative MRI of the thoracic spine. No convincing cord signal changes to suggest involvement with demyelinating disease. No abnormal enhancement. Electronically Signed   By: Rise Mu M.D.   On: 02/03/2020 01:03   MR Cervical Spine W or Wo Contrast  Result Date: 02/03/2020 EXAM: MRI HEAD WITHOUT AND WITH CONTRAST MRI CERVICAL SPINE WITHOUT AND WITH CONTRAST MRI THORACIC SPINE WITHOUT AND WITH CONTRAST TECHNIQUE: Multiplanar, multiecho pulse sequences of the brain and surrounding structures, and cervical spine, to include the craniocervical junction and cervicothoracic junction, and thoracic spine were obtained without and with intravenous contrast. CONTRAST:  73mL GADAVIST GADOBUTROL 1 MMOL/ML IV SOLN COMPARISON:  Prior head CT from earlier the same day. FINDINGS: MRI HEAD FINDINGS Brain: Cerebral volume within normal limits. Multiple scattered foci of T2/FLAIR hyperintensity seen involving the periventricular, deep, and subcortical white matter of both cerebral hemispheres, highly suspicious for demyelinating disease. Several of these foci are oriented perpendicular to the lateral ventricles. A dominant subcortical lesion measuring 1.6 cm noted at the subcortical left parietal lobe (series 12, image 15). At least 1  infratentorial lesion seen at the left cerebellum. Following contrast administration, patchy enhancement seen about several  lesions, most notable at the left parietal lobe (series 49, image 90 and right frontal lobe (series 49, image 96). No evidence for acute or subacute infarct. Gray-white matter differentiation maintained. No encephalomalacia to suggest chronic cortical infarction. No evidence for acute or chronic intracranial hemorrhage. No mass lesion, midline shift or mass effect. No hydrocephalus or extra-axial fluid collection. Pituitary gland within normal limits for age. Midline structures intact. Vascular: Major intracranial vascular flow voids are maintained. Skull and upper cervical spine: Craniocervical junction within normal limits. Bone marrow signal intensity normal. No scalp soft tissue abnormality. Sinuses/Orbits: Globes orbital soft tissues within normal limits. Small left maxillary sinus retention cyst. Paranasal sinuses are otherwise clear. No significant mastoid effusion. Inner ear structures grossly normal. Other: None. MRI CERVICAL SPINE FINDINGS Alignment: Straightening of the normal cervical lordosis. No listhesis. Vertebrae: Vertebral body height well maintained without acute or chronic fracture. Bone marrow signal intensity within normal limits. No discrete or worrisome osseous lesions. No abnormal marrow edema or enhancement. Cord: Patchy signal abnormality seen at the medulla/craniocervical junction (series 11, image 9). Additional subtle patchy signal abnormality within the left dorsal cord at the level of C2-3 (series 11, image 9). Focal cord signal abnormality within the central/dorsal cord at the level of C3-4 (series 11, image 8) patchy cord signal abnormality at the dorsal cord at the level of C5 (series 11, image 8). Additional vague signal abnormality at the level of C6. Findings consistent with demyelinating disease. No abnormal enhancement to suggest active demyelination.  Underlying cord caliber within normal limits. Posterior Fossa, vertebral arteries, paraspinal tissues: Negative. Disc levels: No significant underlying disc pathology. No stenosis or neural impingement. MRI THORACIC SPINE FINDINGS Alignment: Vertebral bodies normally aligned with preservation of the normal thoracic kyphosis. No listhesis. Vertebrae: Vertebral body height maintained without acute or chronic fracture. Bone marrow signal intensity within normal limits. No discrete or worrisome osseous lesions. No abnormal marrow edema or enhancement. Cord: No convincing cord signal abnormality seen within the thoracic spine to suggest demyelinating disease. Underlying cord caliber within normal limits. No abnormal enhancement. Paraspinous soft tissues: Negative. Disc levels: Minimal/early spondylosis noted at T5-6 and T6-7 without significant stenosis or impingement. Minimal facet hypertrophy noted within the lower thoracic spine. No significant stenosis or neural impingement. IMPRESSION: MRI HEAD IMPRESSION: 1. Patchy multifocal signal abnormality involving both the supratentorial and infratentorial cerebral white matter, compatible with demyelinating disease/multiple sclerosis. Patchy post-contrast enhancement about several lesions consistent with active demyelination. 2. Otherwise normal brain MRI. MRI CERVICAL SPINE IMPRESSION: 1. Patchy multifocal signal abnormality throughout the cervical spinal cord extending from the brainstem through C6, consistent with demyelinating disease/multiple sclerosis. No evidence for active demyelination within the cervical spine. 2. Otherwise normal MRI of the cervical spine. MRI THORACIC SPINE IMPRESSION: Negative MRI of the thoracic spine. No convincing cord signal changes to suggest involvement with demyelinating disease. No abnormal enhancement. Electronically Signed   By: Rise Mu M.D.   On: 02/03/2020 01:03   MR THORACIC SPINE W WO CONTRAST  Result Date:  02/03/2020 EXAM: MRI HEAD WITHOUT AND WITH CONTRAST MRI CERVICAL SPINE WITHOUT AND WITH CONTRAST MRI THORACIC SPINE WITHOUT AND WITH CONTRAST TECHNIQUE: Multiplanar, multiecho pulse sequences of the brain and surrounding structures, and cervical spine, to include the craniocervical junction and cervicothoracic junction, and thoracic spine were obtained without and with intravenous contrast. CONTRAST:  9mL GADAVIST GADOBUTROL 1 MMOL/ML IV SOLN COMPARISON:  Prior head CT from earlier the same day. FINDINGS: MRI HEAD FINDINGS Brain: Cerebral volume within normal  limits. Multiple scattered foci of T2/FLAIR hyperintensity seen involving the periventricular, deep, and subcortical white matter of both cerebral hemispheres, highly suspicious for demyelinating disease. Several of these foci are oriented perpendicular to the lateral ventricles. A dominant subcortical lesion measuring 1.6 cm noted at the subcortical left parietal lobe (series 12, image 15). At least 1 infratentorial lesion seen at the left cerebellum. Following contrast administration, patchy enhancement seen about several lesions, most notable at the left parietal lobe (series 49, image 90 and right frontal lobe (series 49, image 96). No evidence for acute or subacute infarct. Gray-white matter differentiation maintained. No encephalomalacia to suggest chronic cortical infarction. No evidence for acute or chronic intracranial hemorrhage. No mass lesion, midline shift or mass effect. No hydrocephalus or extra-axial fluid collection. Pituitary gland within normal limits for age. Midline structures intact. Vascular: Major intracranial vascular flow voids are maintained. Skull and upper cervical spine: Craniocervical junction within normal limits. Bone marrow signal intensity normal. No scalp soft tissue abnormality. Sinuses/Orbits: Globes orbital soft tissues within normal limits. Small left maxillary sinus retention cyst. Paranasal sinuses are otherwise clear.  No significant mastoid effusion. Inner ear structures grossly normal. Other: None. MRI CERVICAL SPINE FINDINGS Alignment: Straightening of the normal cervical lordosis. No listhesis. Vertebrae: Vertebral body height well maintained without acute or chronic fracture. Bone marrow signal intensity within normal limits. No discrete or worrisome osseous lesions. No abnormal marrow edema or enhancement. Cord: Patchy signal abnormality seen at the medulla/craniocervical junction (series 11, image 9). Additional subtle patchy signal abnormality within the left dorsal cord at the level of C2-3 (series 11, image 9). Focal cord signal abnormality within the central/dorsal cord at the level of C3-4 (series 11, image 8) patchy cord signal abnormality at the dorsal cord at the level of C5 (series 11, image 8). Additional vague signal abnormality at the level of C6. Findings consistent with demyelinating disease. No abnormal enhancement to suggest active demyelination. Underlying cord caliber within normal limits. Posterior Fossa, vertebral arteries, paraspinal tissues: Negative. Disc levels: No significant underlying disc pathology. No stenosis or neural impingement. MRI THORACIC SPINE FINDINGS Alignment: Vertebral bodies normally aligned with preservation of the normal thoracic kyphosis. No listhesis. Vertebrae: Vertebral body height maintained without acute or chronic fracture. Bone marrow signal intensity within normal limits. No discrete or worrisome osseous lesions. No abnormal marrow edema or enhancement. Cord: No convincing cord signal abnormality seen within the thoracic spine to suggest demyelinating disease. Underlying cord caliber within normal limits. No abnormal enhancement. Paraspinous soft tissues: Negative. Disc levels: Minimal/early spondylosis noted at T5-6 and T6-7 without significant stenosis or impingement. Minimal facet hypertrophy noted within the lower thoracic spine. No significant stenosis or neural  impingement. IMPRESSION: MRI HEAD IMPRESSION: 1. Patchy multifocal signal abnormality involving both the supratentorial and infratentorial cerebral white matter, compatible with demyelinating disease/multiple sclerosis. Patchy post-contrast enhancement about several lesions consistent with active demyelination. 2. Otherwise normal brain MRI. MRI CERVICAL SPINE IMPRESSION: 1. Patchy multifocal signal abnormality throughout the cervical spinal cord extending from the brainstem through C6, consistent with demyelinating disease/multiple sclerosis. No evidence for active demyelination within the cervical spine. 2. Otherwise normal MRI of the cervical spine. MRI THORACIC SPINE IMPRESSION: Negative MRI of the thoracic spine. No convincing cord signal changes to suggest involvement with demyelinating disease. No abnormal enhancement. Electronically Signed   By: Rise Mu M.D.   On: 02/03/2020 01:03     Assessment/Plan Principal Problem:   Demyelinating disease (HCC)    1. Patient symptoms are concerning for  multiple sclerosis -discussed with on-call neurologist Dr. Liliane Channel plan is to place patient on 1 g IV methylprednisolone daily for next 5 days and Protonix follow CBGs closely. 2. History of migraine headaches.   DVT prophylaxis: SCDs for now.  Per neurologist heparin can be started tomorrow on September 14 and 21 for DVT prophylaxis.  Holding off for now in anticipation of possible lumbar puncture. Code Status: Full code. Family Communication: Patient's father. Disposition Plan: Home. Consults called: Neurologist. Admission status: Observation.   Eduard Clos MD [facility] Pager [phone].  If 7PM-7AM, please contact night-coverage [email or web address] Password [email or web address]  02/03/2020, 1:40 AM

## 2020-02-03 NOTE — Progress Notes (Addendum)
  PROGRESS NOTE  Patient admitted earlier this morning. See H&P.   Tami Mcpherson is a 19 y.o. female with history of migraine (has not had any migraine attacks for more than 2 years) who has been recently experiencing right upper and lower extremity numbness off and on had seen neurologist in July 2021 started experiencing right IV retro-orbital pain with progressive blurriness of the vision. In the ER, CT head shows possible mass versus demyelinating disease and patient was transferred from [facility] to [facility].  At [facility] MRI brain C-spine and T-spine were done which shows demyelinating lesions concerning for multiple sclerosis.  On-call neurologist was consulted patient started on IV steroids and admitted for further observation.   Patient seen and examined in the emergency department.  States that her eye pain has improved since being in the hospital.  A/P:  MS -MRI brain: Patchy multifocal signal abnormality involving both the supratentorial and infratentorial cerebral white matter, compatible with demyelinating disease/multiple sclerosis. Patchy post-contrast enhancement about several lesions consistent with active demyelination. -MRI cervical spine: Patchy multifocal signal abnormality throughout the cervical spinal cord extending from the brainstem through C6, consistent with demyelinating disease/multiple sclerosis. No evidence for active demyelination within the cervical spine. -MRI thoracic spine: Negative MRI of the thoracic spine -Appreciate neurology -IV Solu-Medrol x5 days -PT OT    Status is: Observation  The patient will require care spanning > 2 midnights and should be moved to inpatient because: IV treatments appropriate due to intensity of illness or inability to take PO  Dispo: The patient is from: Home              Anticipated d/c is to: Home              Anticipated d/c date is: > 3 days              Patient currently is not  medically stable to d/c.  Continue IV Solu-Medrol for 5 doses   Noralee Stain, DO [facility] 02/03/2020, 11:01 AM  Available via Epic secure chat 7am-7pm After these hours, please refer to coverage provider listed on [email or web address]

## 2020-02-03 NOTE — Consult Note (Addendum)
Neurology Consultation Reason for Consult: MRI concerning for new MS diagnosis  Referring Physician: Delora Fuel  CC: Right eye blurred vision and headache  History is obtained from: Patient and chart review   HPI: Tami Mcpherson is a 19 y.o. female with a past medical history significant for headaches presenting with classic findings for right eye optic neuritis.  She reports that on Friday at around 10 AM she started to have difficulty seeing out of her right eye.  She felt like this may be improved over the course of the day, but when she woke on Saturday it was just as bad as it was on Friday morning.  It worsened again on Sunday to the point that she now only has minimal light perception in the eye.  The worsening prompted her to come in for evaluation.  Notably, she has had multiple transient neurological events, with the earliest that she can remember in the eighth grade when she lost strength in her right side.  At other times she has lost sensation in her right side of her head other areas of sensory loss that she never thought too much of at the time.  She has been having Lhermitte's sign on the left side of her neck that has been intermittent for some time.  She denies Uhthoff's phenomena.   Review of systems is notable for baseline double vision, no recent fever, chills, cold, travel, no migrating polyarthralgias, no dry eyes or dry mouth, and odd recurrent rash every June/July splotchy redness of different parts of her body without clear trigger (specifically denies photosensitivity, specifically denies malar rash).  She does not have bowel or bladder symptoms, and does not have any difficulty walking.  She does have longstanding history of headaches that are fairly migrainous in nature other than they are not associated with any nausea or vomiting.  They do typically last about 2 days, but severe ones have gone on as long as month.  She endorses baseline double vision.  Notably she has  not been COVID-19 vaccinated  ROS: A 14 point ROS was performed and is negative except as noted in the HPI.   Past Medical History:  Diagnosis Date  . Migraine    Her current medications include Nexplanon for birth control, vitamin C, and intermittent zinc supplementation to boost immunity  Family history: Specifically denies any autoimmune diseases in the family such as lupus, thyroiditis, rheumatoid arthritis etc. Maternal grandfather had diabetes, paternal grandmother had clear fluid, father has atrial fibrillation  Social History:  reports that she has never smoked. She has never used smokeless tobacco. She reports that she does not drink alcohol and does not use drugs.   Exam: Current vital signs: BP 124/71 (BP Location: Right Arm)   Pulse 61   Temp 98.8 F (37.1 C) (Oral)   Resp 14   Ht 5' 8"  (1.727 m)   Wt 90.7 kg   LMP 01/26/2020   SpO2 100%   BMI 30.41 kg/m  Vital signs in last 24 hours: Temp:  [98.5 F (36.9 C)-99 F (37.2 C)] 98.8 F (37.1 C) (09/13 0030) Pulse Rate:  [58-70] 61 (09/13 0030) Resp:  [14-18] 14 (09/13 0030) BP: (99-144)/(62-105) 124/71 (09/13 0030) SpO2:  [98 %-100 %] 100 % (09/13 0030) Weight:  [90.7 kg] 90.7 kg (09/12 1607)   Physical Exam  Constitutional: Appears well-developed and well-nourished.  Psych: Affect appropriate to situation Eyes: No scleral injection HENT: No OP obstrucion MSK: no joint deformities.  Cardiovascular: Normal rate  and regular rhythm.  Respiratory: Effort normal, non-labored breathing GI: Soft.  No distension. There is no tenderness.  Skin: WDI  Neuro: Mental Status: Patient is awake, alert, oriented to person, place, month, year, and situation. Patient is able to give a clear and coherent history. No signs of aphasia or neglect. Cranial Nerves: II: Right eye is light perception only.  Left eye is 20/20 with corrective lenses.  There is a clear right APD. III,IV, VI: EOMI without ptosis or diploplia  with corrective lenses V: Facial sensation is symmetric to temperature VII: Facial movement is symmetric.  VIII: hearing is intact to voice X: Uvula elevates symmetrically XI: Shoulder shrug is symmetric. XII: tongue is midline without atrophy or fasciculations.  Motor: Tone is mildly spastic. Bulk is normal. 5/5 strength was present in all four extremities.  Sensory: Sensation is symmetric to light touch and temperature in the arms and legs, but there is a patch on her left back just above the level of the iliac crest where there is reduced sensation to pin.  Otherwise there is no clear sensory level. Deep Tendon Reflexes: Hyperreflexic throughout, 3+ brachioradialis, biceps, triceps, ankle.  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 Plantars: Toes are downgoing bilaterally.  Cerebellar: Dysmetria with the left upper extremity only; the other 3 extremities are intact Gait: Able to rise on heels and toes.  Able to tandem gait.  Negative Romberg    I have reviewed labs in epic and the results pertinent to this consultation are: Cr 0.67 WBC 7.7  I have reviewed the images obtained: MRI brain, C, T-spine meeting MacDonald's criteria with the dissemination in space and time given the presence of nonenhancing and enhancing lesions in a typical pattern for MS.    Impression:   Recommendations: Her description of prior clinical events alone is sufficient to meet MS diagnostic criteria. MRI brain, C, T-spine meeting MacDonald's criteria with the dissemination in space and time given the presence of nonenhancing and enhancing lesions in a typical pattern for MS. very low concern for infection given patient afebrile, with no leukocytosis, and looking so clinically well.  Her annual rashes are a bit of a mystery, and could be autoimmune in nature though I favor some environmental exposure given that they occur at the same time every year.  I will send a basic  rheumatology panel as initial screening tool.  In her favor, with her first episode at 19 years old and minimal neurological deficits despite lack of disease modifying treatment, I do feel her prognosis is quite good.  Given that she has endorsed increasing use in the past,  # Multiple sclerosis - Lumbar puncture with basic studies and oligoclonal bands  - Pulse dose steroids (1000 mg daily) 5 day course once basic LP studies result   - Patient has been counseled about risk of mood exacerbations/ psychosis, avascular necrosis of the hip, hyperglycemia, infection, hyperphagia - Glucose checks TID AC while receiving steroids  - Protonix for GI ppx  - ESR, CRP ANA, dsDNA, C3/C4, MOG, Cu/Zn, Vitamin D - Dr. Everette Rank, her outpatient neurologist can continue to follow on discharge.   Lesleigh Noe MD-PhD [facility] [phone]

## 2020-02-03 NOTE — Evaluation (Signed)
Physical Therapy Evaluation and Discharge Patient Details Name: Tami Mcpherson MRN: [id number] DOB: 10/06/2000 Today's Date: 02/03/2020   History of Present Illness  Pt is a 19 y/o female admitted secondary to R eye visual changes. 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 No pertinent PMH.   Clinical Impression  Patient evaluated by Physical Therapy with no further acute PT needs identified. All education has been completed and the patient has no further questions. Pt overall independent with mobility tasks. Discussed importance of visual scanning to the R given visual deficits. Pt able to negotiate obstacles on the R independently. Discussed importance of monitoring fatigues level as well. Pt reports no further questions. See below for any follow-up Physical Therapy or equipment needs. PT is signing off. Thank you for this referral. If needs change, please re-consult.      Follow Up Recommendations No PT follow up    Equipment Recommendations  None recommended by PT    Recommendations for Other Services       Precautions / Restrictions Precautions Precautions: Fall Restrictions Weight Bearing Restrictions: No      Mobility  Bed Mobility Overal bed mobility: Independent                Transfers Overall transfer level: Independent                  Ambulation/Gait Ambulation/Gait assistance: Independent           General Gait Details: Pt overall independent with mobility. Discussed importance of visual scanning to the R and pt able to negotiate obstacles on the R.   Stairs            Wheelchair Mobility    Modified Rankin (Stroke Patients Only)       Balance Overall balance assessment: Independent                                           Pertinent Vitals/Pain Pain Assessment: No/denies pain    Home Living Family/patient expects to be discharged to:: Private residence Living Arrangements: Parent Available Help at  Discharge: Family;Available 24 hours/day Type of Home: House Home Access: Stairs to enter Entrance Stairs-Rails: Left Entrance Stairs-Number of Steps: 2 Home Layout: One level Home Equipment: Bedside commode      Prior Function Level of Independence: Independent               Hand Dominance        Extremity/Trunk Assessment   Upper Extremity Assessment Upper Extremity Assessment: Overall WFL for tasks assessed    Lower Extremity Assessment Lower Extremity Assessment: Overall WFL for tasks assessed    Cervical / Trunk Assessment Cervical / Trunk Assessment: Normal  Communication   Communication: No difficulties  Cognition Arousal/Alertness: Awake/alert Behavior During Therapy: WFL for tasks assessed/performed Overall Cognitive Status: Within Functional Limits for tasks assessed                                        General Comments General comments (skin integrity, edema, etc.): Discussed importance of monitoring fatigue levels considering imaging findings.     Exercises     Assessment/Plan    PT Assessment Patent does not need any further PT services  PT Problem List         PT Treatment Interventions  PT Goals (Current goals can be found in the Care Plan section)  Acute Rehab PT Goals Patient Stated Goal: to go home PT Goal Formulation: With patient Time For Goal Achievement: 02/03/20 Potential to Achieve Goals: Good    Frequency     Barriers to discharge        Co-evaluation               AM-PAC PT "6 Clicks" Mobility  Outcome Measure Help needed turning from your back to your side while in a flat bed without using bedrails?: None Help needed moving from lying on your back to sitting on the side of a flat bed without using bedrails?: None Help needed moving to and from a bed to a chair (including a wheelchair)?: None Help needed standing up from a chair using your arms (e.g., wheelchair or bedside chair)?:  None Help needed to walk in hospital room?: None Help needed climbing 3-5 steps with a railing? : None 6 Click Score: 24    End of Session Equipment Utilized During Treatment: Gait belt Activity Tolerance: Patient tolerated treatment well Patient left: in bed;with call bell/phone within reach (in ED ) Nurse Communication: Mobility status      Time: 2831-5176 PT Time Calculation (min) (ACUTE ONLY): 13 min   Charges:   PT Evaluation $PT Eval Low Complexity: 1 Low          Cindee Salt, DPT  Acute Rehabilitation Services  Pager: [phone] Office: [phone]   Lehman Prom 02/03/2020, 2:03 PM

## 2020-02-03 NOTE — ED Provider Notes (Addendum)
12:04 AM Patient care assumed in transfer from MD Silverio Lay.  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  States that her pain was rated at 8/10 on arrival to the ED.  Following migraine cocktail, she has had improvement in her headache and presently rates her pain at 0/10.    She was ambulated to the restroom.  Pending MRI for further evaluation of areas of hypoattenuation seen on CT scan.  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 No evidence of lesions in the T spine. Ordered 1g Solumedrol. Spoke with MD Iver Nestle of Neurology who will follow in consultation. Plan admission to IM service.  1:37 AM Dr. Toniann Fail to admit.   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)   Critical care was time spent personally by me on the following activities:  Discussions with consultants, evaluation of patient's response to treatment, examination of patient, ordering and performing treatments and interventions, ordering and review of laboratory studies, ordering and review of radiographic studies, pulse oximetry, re-evaluation of patient's condition, obtaining history from patient or surrogate and review of old charts    Results for orders placed or performed during the hospital encounter of 02/02/20  CBC with Differential/Platelet  Result Value Ref Range   WBC 7.2 4.0 - 10.5 K/uL   RBC 4.77 3.87 - 5.11 MIL/uL   Hemoglobin 13.8 12.0 - 15.0 g/dL   HCT 16.1 36 - 46 %   MCV 87.6 80.0 - 100.0 fL   MCH 28.9 26.0 - 34.0 pg   MCHC 33.0 30.0 - 36.0 g/dL   RDW 09.6 04.5 - 40.9 %   Platelets 258 150 - 400 K/uL    nRBC 0.0 0.0 - 0.2 %   Neutrophils Relative % 52 %   Neutro Abs 3.7 1.7 - 7.7 K/uL   Lymphocytes Relative 33 %   Lymphs Abs 2.4 0.7 - 4.0 K/uL   Monocytes Relative 9 %   Monocytes Absolute 0.7 0 - 1 K/uL   Eosinophils Relative 5 %   Eosinophils Absolute 0.3 0 - 0 K/uL   Basophils Relative 1 %   Basophils Absolute 0.1 0 - 0 K/uL   Immature Granulocytes 0 %   Abs Immature Granulocytes 0.01 0.00 - 0.07 K/uL  Basic metabolic panel  Result Value Ref Range   Sodium 141 135 - 145 mmol/L   Potassium 4.0 3.5 - 5.1 mmol/L   Chloride 105 98 - 111 mmol/L   CO2 25 22 - 32 mmol/L   Glucose, Bld 93 70 - 99 mg/dL   BUN 7 6 - 20 mg/dL   Creatinine, Ser 8.11 0.44 - 1.00 mg/dL   Calcium 9.5 8.9 - 91.4 mg/dL   GFR calc non Af Amer >60 >60 mL/min   GFR calc Af Amer >60 >60 mL/min   Anion gap 11 5 - 15  Pregnancy, urine  Result Value Ref Range   Preg Test, Ur NEGATIVE NEGATIVE   CT Head Wo Contrast  Result Date: 02/02/2020 CLINICAL DATA:  Headache, migraine without history of trauma. Nausea and vomiting this morning. EXAM: CT HEAD WITHOUT CONTRAST TECHNIQUE: Contiguous axial images were obtained from the base  of the skull through the vertex without intravenous contrast. COMPARISON:  Oct 07, 2016 FINDINGS: Brain: Vague area of hypodensity in the posterior LEFT parietal lobe. This is at the gray-white junction with some extension towards the cortex but without frank involvement of the cortex. No intracranial hemorrhage. No mass effect. No midline shift. No abnormal extra-axial fluid collection. Vascular: No hyperdense vessel or unexpected calcification. Skull: Normal. Negative for fracture or focal lesion. Sinuses/Orbits: Sinuses are incompletely imaged without acute abnormality. Orbits without acute process. Other: None IMPRESSION: 1. Vague area of hypoattenuation in subcortical white matter of the LEFT parietal lobe is of uncertain significance, suspicious for recent ischemia, underlying mass lesion or  demyelinating process among other differential considerations. Brain MRI is suggested for further evaluation. These results were called by telephone at the time of interpretation on 02/02/2020 at 7:53 pm to provider DAVID YAO , who verbally acknowledged these results. Electronically Signed   By: Donzetta Kohut M.D.   On: 02/02/2020 19:50   MR Brain W and Wo Contrast  Result Date: 02/03/2020 EXAM: MRI HEAD WITHOUT AND WITH CONTRAST MRI CERVICAL SPINE WITHOUT AND WITH CONTRAST MRI THORACIC SPINE WITHOUT AND WITH CONTRAST TECHNIQUE: Multiplanar, multiecho pulse sequences of the brain and surrounding structures, and cervical spine, to include the craniocervical junction and cervicothoracic junction, and thoracic spine were obtained without and with intravenous contrast. CONTRAST:  110mL GADAVIST GADOBUTROL 1 MMOL/ML IV SOLN COMPARISON:  Prior head CT from earlier the same day. FINDINGS: MRI HEAD FINDINGS Brain: Cerebral volume within normal limits. Multiple scattered foci of T2/FLAIR hyperintensity seen involving the periventricular, deep, and subcortical white matter of both cerebral hemispheres, highly suspicious for demyelinating disease. Several of these foci are oriented perpendicular to the lateral ventricles. A dominant subcortical lesion measuring 1.6 cm noted at the subcortical left parietal lobe (series 12, image 15). At least 1 infratentorial lesion seen at the left cerebellum. Following contrast administration, patchy enhancement seen about several lesions, most notable at the left parietal lobe (series 49, image 90 and right frontal lobe (series 49, image 96). No evidence for acute or subacute infarct. Gray-white matter differentiation maintained. No encephalomalacia to suggest chronic cortical infarction. No evidence for acute or chronic intracranial hemorrhage. No mass lesion, midline shift or mass effect. No hydrocephalus or extra-axial fluid collection. Pituitary gland within normal limits for age.  Midline structures intact. Vascular: Major intracranial vascular flow voids are maintained. Skull and upper cervical spine: Craniocervical junction within normal limits. Bone marrow signal intensity normal. No scalp soft tissue abnormality. Sinuses/Orbits: Globes orbital soft tissues within normal limits. Small left maxillary sinus retention cyst. Paranasal sinuses are otherwise clear. No significant mastoid effusion. Inner ear structures grossly normal. Other: None. MRI CERVICAL SPINE FINDINGS Alignment: Straightening of the normal cervical lordosis. No listhesis. Vertebrae: Vertebral body height well maintained without acute or chronic fracture. Bone marrow signal intensity within normal limits. No discrete or worrisome osseous lesions. No abnormal marrow edema or enhancement. Cord: Patchy signal abnormality seen at the medulla/craniocervical junction (series 11, image 9). Additional subtle patchy signal abnormality within the left dorsal cord at the level of C2-3 (series 11, image 9). Focal cord signal abnormality within the central/dorsal cord at the level of C3-4 (series 11, image 8) patchy cord signal abnormality at the dorsal cord at the level of C5 (series 11, image 8). Additional vague signal abnormality at the level of C6. Findings consistent with demyelinating disease. No abnormal enhancement to suggest active demyelination. Underlying cord caliber within normal limits. Posterior  Fossa, vertebral arteries, paraspinal tissues: Negative. Disc levels: No significant underlying disc pathology. No stenosis or neural impingement. MRI THORACIC SPINE FINDINGS Alignment: Vertebral bodies normally aligned with preservation of the normal thoracic kyphosis. No listhesis. Vertebrae: Vertebral body height maintained without acute or chronic fracture. Bone marrow signal intensity within normal limits. No discrete or worrisome osseous lesions. No abnormal marrow edema or enhancement. Cord: No convincing cord signal  abnormality seen within the thoracic spine to suggest demyelinating disease. Underlying cord caliber within normal limits. No abnormal enhancement. Paraspinous soft tissues: Negative. Disc levels: Minimal/early spondylosis noted at T5-6 and T6-7 without significant stenosis or impingement. Minimal facet hypertrophy noted within the lower thoracic spine. No significant stenosis or neural impingement. IMPRESSION: MRI HEAD IMPRESSION: 1. Patchy multifocal signal abnormality involving both the supratentorial and infratentorial cerebral white matter, compatible with demyelinating disease/multiple sclerosis. Patchy post-contrast enhancement about several lesions consistent with active demyelination. 2. Otherwise normal brain MRI. MRI CERVICAL SPINE IMPRESSION: 1. Patchy multifocal signal abnormality throughout the cervical spinal cord extending from the brainstem through C6, consistent with demyelinating disease/multiple sclerosis. No evidence for active demyelination within the cervical spine. 2. Otherwise normal MRI of the cervical spine. MRI THORACIC SPINE IMPRESSION: Negative MRI of the thoracic spine. No convincing cord signal changes to suggest involvement with demyelinating disease. No abnormal enhancement. Electronically Signed   By: Rise Mu M.D.   On: 02/03/2020 01:03   MR Cervical Spine W or Wo Contrast  Result Date: 02/03/2020 EXAM: MRI HEAD WITHOUT AND WITH CONTRAST MRI CERVICAL SPINE WITHOUT AND WITH CONTRAST MRI THORACIC SPINE WITHOUT AND WITH CONTRAST TECHNIQUE: Multiplanar, multiecho pulse sequences of the brain and surrounding structures, and cervical spine, to include the craniocervical junction and cervicothoracic junction, and thoracic spine were obtained without and with intravenous contrast. CONTRAST:  9mL GADAVIST GADOBUTROL 1 MMOL/ML IV SOLN COMPARISON:  Prior head CT from earlier the same day. FINDINGS: MRI HEAD FINDINGS Brain: Cerebral volume within normal limits. Multiple  scattered foci of T2/FLAIR hyperintensity seen involving the periventricular, deep, and subcortical white matter of both cerebral hemispheres, highly suspicious for demyelinating disease. Several of these foci are oriented perpendicular to the lateral ventricles. A dominant subcortical lesion measuring 1.6 cm noted at the subcortical left parietal lobe (series 12, image 15). At least 1 infratentorial lesion seen at the left cerebellum. Following contrast administration, patchy enhancement seen about several lesions, most notable at the left parietal lobe (series 49, image 90 and right frontal lobe (series 49, image 96). No evidence for acute or subacute infarct. Gray-white matter differentiation maintained. No encephalomalacia to suggest chronic cortical infarction. No evidence for acute or chronic intracranial hemorrhage. No mass lesion, midline shift or mass effect. No hydrocephalus or extra-axial fluid collection. Pituitary gland within normal limits for age. Midline structures intact. Vascular: Major intracranial vascular flow voids are maintained. Skull and upper cervical spine: Craniocervical junction within normal limits. Bone marrow signal intensity normal. No scalp soft tissue abnormality. Sinuses/Orbits: Globes orbital soft tissues within normal limits. Small left maxillary sinus retention cyst. Paranasal sinuses are otherwise clear. No significant mastoid effusion. Inner ear structures grossly normal. Other: None. MRI CERVICAL SPINE FINDINGS Alignment: Straightening of the normal cervical lordosis. No listhesis. Vertebrae: Vertebral body height well maintained without acute or chronic fracture. Bone marrow signal intensity within normal limits. No discrete or worrisome osseous lesions. No abnormal marrow edema or enhancement. Cord: Patchy signal abnormality seen at the medulla/craniocervical junction (series 11, image 9). Additional subtle patchy signal abnormality within the left  dorsal cord at the level  of C2-3 (series 11, image 9). Focal cord signal abnormality within the central/dorsal cord at the level of C3-4 (series 11, image 8) patchy cord signal abnormality at the dorsal cord at the level of C5 (series 11, image 8). Additional vague signal abnormality at the level of C6. Findings consistent with demyelinating disease. No abnormal enhancement to suggest active demyelination. Underlying cord caliber within normal limits. Posterior Fossa, vertebral arteries, paraspinal tissues: Negative. Disc levels: No significant underlying disc pathology. No stenosis or neural impingement. MRI THORACIC SPINE FINDINGS Alignment: Vertebral bodies normally aligned with preservation of the normal thoracic kyphosis. No listhesis. Vertebrae: Vertebral body height maintained without acute or chronic fracture. Bone marrow signal intensity within normal limits. No discrete or worrisome osseous lesions. No abnormal marrow edema or enhancement. Cord: No convincing cord signal abnormality seen within the thoracic spine to suggest demyelinating disease. Underlying cord caliber within normal limits. No abnormal enhancement. Paraspinous soft tissues: Negative. Disc levels: Minimal/early spondylosis noted at T5-6 and T6-7 without significant stenosis or impingement. Minimal facet hypertrophy noted within the lower thoracic spine. No significant stenosis or neural impingement. IMPRESSION: MRI HEAD IMPRESSION: 1. Patchy multifocal signal abnormality involving both the supratentorial and infratentorial cerebral white matter, compatible with demyelinating disease/multiple sclerosis. Patchy post-contrast enhancement about several lesions consistent with active demyelination. 2. Otherwise normal brain MRI. MRI CERVICAL SPINE IMPRESSION: 1. Patchy multifocal signal abnormality throughout the cervical spinal cord extending from the brainstem through C6, consistent with demyelinating disease/multiple sclerosis. No evidence for active demyelination  within the cervical spine. 2. Otherwise normal MRI of the cervical spine. MRI THORACIC SPINE IMPRESSION: Negative MRI of the thoracic spine. No convincing cord signal changes to suggest involvement with demyelinating disease. No abnormal enhancement. Electronically Signed   By: Rise Mu M.D.   On: 02/03/2020 01:03   MR THORACIC SPINE W WO CONTRAST  Result Date: 02/03/2020 EXAM: MRI HEAD WITHOUT AND WITH CONTRAST MRI CERVICAL SPINE WITHOUT AND WITH CONTRAST MRI THORACIC SPINE WITHOUT AND WITH CONTRAST TECHNIQUE: Multiplanar, multiecho pulse sequences of the brain and surrounding structures, and cervical spine, to include the craniocervical junction and cervicothoracic junction, and thoracic spine were obtained without and with intravenous contrast. CONTRAST:  67mL GADAVIST GADOBUTROL 1 MMOL/ML IV SOLN COMPARISON:  Prior head CT from earlier the same day. FINDINGS: MRI HEAD FINDINGS Brain: Cerebral volume within normal limits. Multiple scattered foci of T2/FLAIR hyperintensity seen involving the periventricular, deep, and subcortical white matter of both cerebral hemispheres, highly suspicious for demyelinating disease. Several of these foci are oriented perpendicular to the lateral ventricles. A dominant subcortical lesion measuring 1.6 cm noted at the subcortical left parietal lobe (series 12, image 15). At least 1 infratentorial lesion seen at the left cerebellum. Following contrast administration, patchy enhancement seen about several lesions, most notable at the left parietal lobe (series 49, image 90 and right frontal lobe (series 49, image 96). No evidence for acute or subacute infarct. Gray-white matter differentiation maintained. No encephalomalacia to suggest chronic cortical infarction. No evidence for acute or chronic intracranial hemorrhage. No mass lesion, midline shift or mass effect. No hydrocephalus or extra-axial fluid collection. Pituitary gland within normal limits for age. Midline  structures intact. Vascular: Major intracranial vascular flow voids are maintained. Skull and upper cervical spine: Craniocervical junction within normal limits. Bone marrow signal intensity normal. No scalp soft tissue abnormality. Sinuses/Orbits: Globes orbital soft tissues within normal limits. Small left maxillary sinus retention cyst. Paranasal sinuses are otherwise clear. No significant  mastoid effusion. Inner ear structures grossly normal. Other: None. MRI CERVICAL SPINE FINDINGS Alignment: Straightening of the normal cervical lordosis. No listhesis. Vertebrae: Vertebral body height well maintained without acute or chronic fracture. Bone marrow signal intensity within normal limits. No discrete or worrisome osseous lesions. No abnormal marrow edema or enhancement. Cord: Patchy signal abnormality seen at the medulla/craniocervical junction (series 11, image 9). Additional subtle patchy signal abnormality within the left dorsal cord at the level of C2-3 (series 11, image 9). Focal cord signal abnormality within the central/dorsal cord at the level of C3-4 (series 11, image 8) patchy cord signal abnormality at the dorsal cord at the level of C5 (series 11, image 8). Additional vague signal abnormality at the level of C6. Findings consistent with demyelinating disease. No abnormal enhancement to suggest active demyelination. Underlying cord caliber within normal limits. Posterior Fossa, vertebral arteries, paraspinal tissues: Negative. Disc levels: No significant underlying disc pathology. No stenosis or neural impingement. MRI THORACIC SPINE FINDINGS Alignment: Vertebral bodies normally aligned with preservation of the normal thoracic kyphosis. No listhesis. Vertebrae: Vertebral body height maintained without acute or chronic fracture. Bone marrow signal intensity within normal limits. No discrete or worrisome osseous lesions. No abnormal marrow edema or enhancement. Cord: No convincing cord signal abnormality  seen within the thoracic spine to suggest demyelinating disease. Underlying cord caliber within normal limits. No abnormal enhancement. Paraspinous soft tissues: Negative. Disc levels: Minimal/early spondylosis noted at T5-6 and T6-7 without significant stenosis or impingement. Minimal facet hypertrophy noted within the lower thoracic spine. No significant stenosis or neural impingement. IMPRESSION: MRI HEAD IMPRESSION: 1. Patchy multifocal signal abnormality involving both the supratentorial and infratentorial cerebral white matter, compatible with demyelinating disease/multiple sclerosis. Patchy post-contrast enhancement about several lesions consistent with active demyelination. 2. Otherwise normal brain MRI. MRI CERVICAL SPINE IMPRESSION: 1. Patchy multifocal signal abnormality throughout the cervical spinal cord extending from the brainstem through C6, consistent with demyelinating disease/multiple sclerosis. No evidence for active demyelination within the cervical spine. 2. Otherwise normal MRI of the cervical spine. MRI THORACIC SPINE IMPRESSION: Negative MRI of the thoracic spine. No convincing cord signal changes to suggest involvement with demyelinating disease. No abnormal enhancement. Electronically Signed   By: Rise Mu M.D.   On: 02/03/2020 01:03      Antony Madura, PA-C 02/03/20 [id number]    Antony Madura, PA-C 02/03/20 Freddie Breech    Dione Booze, MD 02/03/20 [id number]

## 2020-02-04 LAB — C4 COMPLEMENT: Complement C4, Body Fluid: 19 mg/dL (ref 12–38)

## 2020-02-04 LAB — VITAMIN D 25 HYDROXY (VIT D DEFICIENCY, FRACTURES): Vit D, 25-Hydroxy: 32.28 ng/mL (ref 30–100)

## 2020-02-04 LAB — MISC LABCORP TEST (SEND OUT): Labcorp test code: 505310

## 2020-02-04 LAB — C3 COMPLEMENT: C3 Complement: 150 mg/dL (ref 82–167)

## 2020-02-04 LAB — ANA W/REFLEX IF POSITIVE: Anti Nuclear Antibody (ANA): NEGATIVE

## 2020-02-04 LAB — GLUCOSE, CAPILLARY
Glucose-Capillary: 107 mg/dL — ABNORMAL HIGH (ref 70–99)
Glucose-Capillary: 134 mg/dL — ABNORMAL HIGH (ref 70–99)

## 2020-02-04 MED ORDER — SODIUM CHLORIDE 0.9 % IV SOLN: INTRAVENOUS | Status: DC

## 2020-02-04 NOTE — Plan of Care (Signed)
Pt understanding of discharge instructions  

## 2020-02-04 NOTE — Discharge Instructions (Signed)
Multiple Sclerosis Multiple sclerosis (MS) is a disease of the brain, spinal cord, and optic nerves (central nervous system). It causes the body's disease-fighting (immune) system to destroy the protective covering (myelin sheath) around nerves in the brain. When this happens, signals (nerve impulses) going to and from the brain and spinal cord do not get sent properly or may not get sent at all. There are several types of MS:  Relapsing-remitting MS. This is the most common type. This causes sudden attacks of symptoms. After an attack, you may recover completely until the next attack, or some symptoms may remain permanently.  Secondary progressive MS. This usually develops after the onset of relapsing-remitting MS. Similar to relapsing-remitting MS, this type also causes sudden attacks of symptoms. Attacks may be less frequent, but symptoms slowly get worse (progress) over time.  Primary progressive MS. This causes symptoms that steadily progress over time. This type of MS does not cause sudden attacks of symptoms. The age of onset of MS varies, but it often develops between 20-40 years of age. MS is a lifelong (chronic) condition. There is no cure, but treatment can help slow down the progression of the disease. What are the causes? The cause of this condition is not known. What increases the risk? You are more likely to develop this condition if:  You are a woman.  You have a relative with MS. However, the condition is not passed from parent to child (inherited).  You have a lack (deficiency) of vitamin D.  You smoke. MS is more common in the [place] than in the [place]. What are the signs or symptoms? Relapsing-remitting and secondary progressive MS cause symptoms to occur in episodes or attacks that may last weeks to months. There may be long periods between attacks in which there are almost no symptoms. Primary progressive MS causes symptoms to steadily  progress after they develop. Symptoms of MS vary because of the many different ways it affects the central nervous system. The main symptoms include:  Vision problems and eye pain.  Numbness.  Weakness.  Inability to move your arms, hands, feet, or legs (paralysis).  Balance problems.  Shaking that you cannot control (tremors).  Muscle spasms.  Problems with thinking (cognitive changes). MS can also cause symptoms that are associated with the disease, but are not always the direct result of an MS attack. They may include:  Inability to control urination or bowel movements (incontinence).  Headaches.  Fatigue.  Inability to tolerate heat.  Emotional changes.  Depression.  Pain. How is this diagnosed? This condition is diagnosed based on:  Your symptoms.  A neurological exam. This involves checking central nervous system function, such as nerve function, reflexes, and coordination.  MRIs of the brain and spinal cord.  Lab tests, including a lumbar puncture that tests the fluid that surrounds the brain and spinal cord (cerebrospinal fluid).  Tests to measure the electrical activity of the brain in response to stimulation (evoked potentials). How is this treated? There is no cure for MS, but medicines can help decrease the number and frequency of attacks and help relieve nuisance symptoms. Treatment options may include:  Medicines that reduce the frequency of attacks. These medicines may be given by injection, by mouth (orally), or through an IV.  Medicines that reduce inflammation (steroids). These may provide short-term relief of symptoms.  Medicines to help control pain, depression, fatigue, or incontinence.  Vitamin D, if you have a deficiency.  Using devices to help   you move around (assistive devices), such as braces, a cane, or a walker.  Physical therapy to strengthen and stretch your muscles.  Occupational therapy to help you with everyday  tasks.  Alternative or complementary treatments such as exercise, massage, or acupuncture. Follow these instructions at home:  Take over-the-counter and prescription medicines only as told by your health care provider.  Do not drive or use heavy machinery while taking prescription pain medicine.  Use assistive devices as recommended by your physical therapist or your health care provider.  Exercise as directed by your health care provider.  Return to your normal activities as told by your health care provider. Ask your health care provider what activities are safe for you.  Reach out for support. Share your feelings with friends, family, or a support group.  Keep all follow-up visits as told by your health care provider and therapists. This is important. Where to find more information  National Multiple Sclerosis Society: https://www.nationalmssociety.org Contact a health care provider if:  You feel depressed.  You develop new pain or numbness.  You have tremors.  You have problems with sexual function. Get help right away if:  You develop paralysis.  You develop numbness.  You have problems with your bladder or bowel function.  You develop double vision.  You lose vision in one or both eyes.  You develop suicidal thoughts.  You develop severe confusion. If you ever feel like you may hurt yourself or others, or have thoughts about taking your own life, get help right away. You can go to your nearest emergency department or call:  Your local emergency services (911 in the [place]  A suicide crisis helpline, such as the National Suicide Prevention Lifeline at 1-800-273-8255. This is open 24 hours a day. Summary  Multiple sclerosis (MS) is a disease of the central nervous system that causes the body's immune system to destroy the protective covering (myelin sheath) around nerves in the brain.  There are 3 types of MS: relapsing-remitting, secondary progressive, and  primary progressive. Relapsing-remitting and secondary progressive MS cause symptoms to occur in episodes or attacks that may last weeks to months. Primary progressive MS causes symptoms to steadily progress after they develop.  There is no cure for MS, but medicines can help decrease the number and frequency of attacks and help relieve nuisance symptoms. Treatment may also include physical or occupational therapy.  If you develop numbness, paralysis, vision problems, or other neurological symptoms, get help right away. This information is not intended to replace advice given to you by your health care provider. Make sure you discuss any questions you have with your health care provider. Document Revised: 04/21/2017 Document Reviewed: 07/18/2016 [employer] Patient Education  2020 [employer]  

## 2020-02-04 NOTE — Progress Notes (Signed)
PROGRESS NOTE    Tami Mcpherson  WUJ:[id number] DOB: 2000/07/31 DOA: 02/02/2020 PCP: Lester Oxford., MD     Brief Narrative:  Tami Mcpherson a 19 y.o.femalewithhistory of migraine (has not had any migraine attacks for more than 2 years) who has been recently experiencing right upper and lower extremity numbness off and on had seen neurologist in July 2021 started experiencing right IV retro-orbital pain with progressive blurriness of the vision. In the ER, CT head shows possible mass versus demyelinating disease and patient was transferred from [facility] to [facility]. At [facility] MRI brain C-spine and T-spine were done which shows demyelinating lesions concerning for multiple sclerosis. On-call neurologist was consulted patient started on IV steroids.  New events last 24 hours / Subjective: Complains of dehydration, drinking a lot of fluids but still has a headache and requesting IVF   Assessment & Plan:   Principal Problem:   Demyelinating disease (HCC) Active Problems:   Multiple sclerosis (HCC)   MS -MRI brain: Patchy multifocal signal abnormality involving both the supratentorial and infratentorial cerebral white matter, compatible with demyelinating disease/multiple sclerosis. Patchy post-contrast enhancement about several lesions consistent with active demyelination. -MRI cervical spine: Patchy multifocal signal abnormality throughout the cervical spinal cord extending from the brainstem through C6, consistent with demyelinating disease/multiple sclerosis. No evidence for active demyelination within the cervical spine. -MRI thoracic spine: Negative MRI of the thoracic spine -Appreciate neurology -IV Solu-Medrol x5 days   DVT prophylaxis:  SCDs Start: 02/03/20 0140  Code Status: Full Family Communication: None at bedside Disposition Plan:  Status is: Inpatient  Remains inpatient appropriate because:IV treatments appropriate due to  intensity of illness or inability to take PO   Dispo: The patient is from: Home              Anticipated d/c is to: Home              Anticipated d/c date is: > 3 days              Patient currently is not medically stable to d/c.  Remains on IV Solu-Medrol total 5-day course   Consultants:   Neurology  Procedures:   None  Antimicrobials:  Anti-infectives (From admission, onward)   None        Objective: Vitals:   02/03/20 1305 02/03/20 1825 02/04/20 0014 02/04/20 0645  BP: (!) 142/82 (!) 142/65 133/67 117/70  Pulse: 87 76 80 70  Resp: 16 16 16 16   Temp: 98 F (36.7 C) 98.8 F (37.1 C) 98.4 F (36.9 C) 98.4 F (36.9 C)  TempSrc: Oral Oral Oral Oral  SpO2: 100% 99% 97% 98%  Weight:      Height:       No intake or output data in the 24 hours ending 02/04/20 1057 Filed Weights   02/02/20 1607  Weight: 90.7 kg    Examination:  General exam: Appears calm and comfortable  Respiratory system: Clear to auscultation. Respiratory effort normal. No respiratory distress. No conversational dyspnea.  Cardiovascular system: S1 & S2 heard, RRR. No murmurs. No pedal edema. Gastrointestinal system: Abdomen is nondistended, soft and nontender. Normal bowel sounds heard. Central nervous system: Alert and oriented. No focal neurological deficits. Speech clear.  Extremities: Symmetric in appearance  Skin: No rashes, lesions or ulcers on exposed skin  Psychiatry: Judgement and insight appear normal. Mood & affect appropriate.   Data Reviewed: I have personally reviewed following labs and imaging studies  CBC: Recent  Labs  Lab 02/02/20 1938 02/03/20 0513  WBC 7.2 7.7  NEUTROABS 3.7  --   HGB 13.8 14.3  HCT 41.8 42.7  MCV 87.6 88.0  PLT 258 263   Basic Metabolic Panel: Recent Labs  Lab 02/02/20 1938 02/03/20 0513  NA 141 138  K 4.0 4.1  CL 105 107  CO2 25 18*  GLUCOSE 93 136*  BUN 7 7  CREATININE 0.66 0.67  CALCIUM 9.5 9.6   GFR: Estimated Creatinine  Clearance: 132.1 mL/min (by C-G formula based on SCr of 0.67 mg/dL). Liver Function Tests: No results for input(s): AST, ALT, ALKPHOS, BILITOT, PROT, ALBUMIN in the last 168 hours. No results for input(s): LIPASE, AMYLASE in the last 168 hours. No results for input(s): AMMONIA in the last 168 hours. Coagulation Profile: No results for input(s): INR, PROTIME in the last 168 hours. Cardiac Enzymes: No results for input(s): CKTOTAL, CKMB, CKMBINDEX, TROPONINI in the last 168 hours. BNP (last 3 results) No results for input(s): PROBNP in the last 8760 hours. HbA1C: No results for input(s): HGBA1C in the last 72 hours. CBG: Recent Labs  Lab 02/03/20 0829 02/03/20 1147 02/03/20 1609 02/03/20 2128 02/04/20 0644  GLUCAP 99 262* 242* 153* 107*   Lipid Profile: No results for input(s): CHOL, HDL, LDLCALC, TRIG, CHOLHDL, LDLDIRECT in the last 72 hours. Thyroid Function Tests: No results for input(s): TSH, T4TOTAL, FREET4, T3FREE, THYROIDAB in the last 72 hours. Anemia Panel: No results for input(s): VITAMINB12, FOLATE, FERRITIN, TIBC, IRON, RETICCTPCT in the last 72 hours. Sepsis Labs: No results for input(s): PROCALCITON, LATICACIDVEN in the last 168 hours.  Recent Results (from the past 240 hour(s))  SARS Coronavirus 2 by RT PCR (hospital order, performed in [facility] hospital lab) Nasopharyngeal Nasopharyngeal Swab     Status: None   Collection Time: 02/03/20  2:20 AM   Specimen: Nasopharyngeal Swab  Result Value Ref Range Status   SARS Coronavirus 2 NEGATIVE NEGATIVE Final    Comment: (NOTE) SARS-CoV-2 target nucleic acids are NOT DETECTED.  The SARS-CoV-2 RNA is generally detectable in upper and lower respiratory specimens during the acute phase of infection. The lowest concentration of SARS-CoV-2 viral copies this assay can detect is 250 copies / mL. A negative result does not preclude SARS-CoV-2 infection and should not be used as the sole basis for treatment or  other patient management decisions.  A negative result may occur with improper specimen collection / handling, submission of specimen other than nasopharyngeal swab, presence of viral mutation(s) within the areas targeted by this assay, and inadequate number of viral copies (<250 copies / mL). A negative result must be combined with clinical observations, patient history, and epidemiological information.  Fact Sheet for Patients:   BoilerBrush.com.cy  Fact Sheet for Healthcare Providers: https://pope.com/  This test is not yet approved or  cleared by the [place] FDA and has been authorized for detection and/or diagnosis of SARS-CoV-2 by FDA under an Emergency Use Authorization (EUA).  This EUA will remain in effect (meaning this test can be used) for the duration of the COVID-19 declaration under Section 564(b)(1) of the Act, 21 U.S.C. section 360bbb-3(b)(1), unless the authorization is terminated or revoked sooner.  Performed at [facility], [address], [place], [place] [address]       Radiology Studies: CT Head Wo Contrast  Result Date: 02/02/2020 CLINICAL DATA:  Headache, migraine without history of trauma. Nausea and vomiting this morning. EXAM: CT HEAD WITHOUT CONTRAST TECHNIQUE: Contiguous axial images  were obtained from the base of the skull through the vertex without intravenous contrast. COMPARISON:  Oct 07, 2016 FINDINGS: Brain: Vague area of hypodensity in the posterior LEFT parietal lobe. This is at the gray-white junction with some extension towards the cortex but without frank involvement of the cortex. No intracranial hemorrhage. No mass effect. No midline shift. No abnormal extra-axial fluid collection. Vascular: No hyperdense vessel or unexpected calcification. Skull: Normal. Negative for fracture or focal lesion. Sinuses/Orbits: Sinuses are incompletely imaged without acute abnormality. Orbits without  acute process. Other: None IMPRESSION: 1. Vague area of hypoattenuation in subcortical white matter of the LEFT parietal lobe is of uncertain significance, suspicious for recent ischemia, underlying mass lesion or demyelinating process among other differential considerations. Brain MRI is suggested for further evaluation. These results were called by telephone at the time of interpretation on 02/02/2020 at 7:53 pm to provider DAVID YAO , who verbally acknowledged these results. Electronically Signed   By: Donzetta Kohut M.D.   On: 02/02/2020 19:50   MR Brain W and Wo Contrast  Result Date: 02/03/2020 EXAM: MRI HEAD WITHOUT AND WITH CONTRAST MRI CERVICAL SPINE WITHOUT AND WITH CONTRAST MRI THORACIC SPINE WITHOUT AND WITH CONTRAST TECHNIQUE: Multiplanar, multiecho pulse sequences of the brain and surrounding structures, and cervical spine, to include the craniocervical junction and cervicothoracic junction, and thoracic spine were obtained without and with intravenous contrast. CONTRAST:  38mL GADAVIST GADOBUTROL 1 MMOL/ML IV SOLN COMPARISON:  Prior head CT from earlier the same day. FINDINGS: MRI HEAD FINDINGS Brain: Cerebral volume within normal limits. Multiple scattered foci of T2/FLAIR hyperintensity seen involving the periventricular, deep, and subcortical white matter of both cerebral hemispheres, highly suspicious for demyelinating disease. Several of these foci are oriented perpendicular to the lateral ventricles. A dominant subcortical lesion measuring 1.6 cm noted at the subcortical left parietal lobe (series 12, image 15). At least 1 infratentorial lesion seen at the left cerebellum. Following contrast administration, patchy enhancement seen about several lesions, most notable at the left parietal lobe (series 49, image 90 and right frontal lobe (series 49, image 96). No evidence for acute or subacute infarct. Gray-white matter differentiation maintained. No encephalomalacia to suggest chronic cortical  infarction. No evidence for acute or chronic intracranial hemorrhage. No mass lesion, midline shift or mass effect. No hydrocephalus or extra-axial fluid collection. Pituitary gland within normal limits for age. Midline structures intact. Vascular: Major intracranial vascular flow voids are maintained. Skull and upper cervical spine: Craniocervical junction within normal limits. Bone marrow signal intensity normal. No scalp soft tissue abnormality. Sinuses/Orbits: Globes orbital soft tissues within normal limits. Small left maxillary sinus retention cyst. Paranasal sinuses are otherwise clear. No significant mastoid effusion. Inner ear structures grossly normal. Other: None. MRI CERVICAL SPINE FINDINGS Alignment: Straightening of the normal cervical lordosis. No listhesis. Vertebrae: Vertebral body height well maintained without acute or chronic fracture. Bone marrow signal intensity within normal limits. No discrete or worrisome osseous lesions. No abnormal marrow edema or enhancement. Cord: Patchy signal abnormality seen at the medulla/craniocervical junction (series 11, image 9). Additional subtle patchy signal abnormality within the left dorsal cord at the level of C2-3 (series 11, image 9). Focal cord signal abnormality within the central/dorsal cord at the level of C3-4 (series 11, image 8) patchy cord signal abnormality at the dorsal cord at the level of C5 (series 11, image 8). Additional vague signal abnormality at the level of C6. Findings consistent with demyelinating disease. No abnormal enhancement to suggest active demyelination. Underlying cord  caliber within normal limits. Posterior Fossa, vertebral arteries, paraspinal tissues: Negative. Disc levels: No significant underlying disc pathology. No stenosis or neural impingement. MRI THORACIC SPINE FINDINGS Alignment: Vertebral bodies normally aligned with preservation of the normal thoracic kyphosis. No listhesis. Vertebrae: Vertebral body height  maintained without acute or chronic fracture. Bone marrow signal intensity within normal limits. No discrete or worrisome osseous lesions. No abnormal marrow edema or enhancement. Cord: No convincing cord signal abnormality seen within the thoracic spine to suggest demyelinating disease. Underlying cord caliber within normal limits. No abnormal enhancement. Paraspinous soft tissues: Negative. Disc levels: Minimal/early spondylosis noted at T5-6 and T6-7 without significant stenosis or impingement. Minimal facet hypertrophy noted within the lower thoracic spine. No significant stenosis or neural impingement. IMPRESSION: MRI HEAD IMPRESSION: 1. Patchy multifocal signal abnormality involving both the supratentorial and infratentorial cerebral white matter, compatible with demyelinating disease/multiple sclerosis. Patchy post-contrast enhancement about several lesions consistent with active demyelination. 2. Otherwise normal brain MRI. MRI CERVICAL SPINE IMPRESSION: 1. Patchy multifocal signal abnormality throughout the cervical spinal cord extending from the brainstem through C6, consistent with demyelinating disease/multiple sclerosis. No evidence for active demyelination within the cervical spine. 2. Otherwise normal MRI of the cervical spine. MRI THORACIC SPINE IMPRESSION: Negative MRI of the thoracic spine. No convincing cord signal changes to suggest involvement with demyelinating disease. No abnormal enhancement. Electronically Signed   By: Rise Mu M.D.   On: 02/03/2020 01:03   MR Cervical Spine W or Wo Contrast  Result Date: 02/03/2020 EXAM: MRI HEAD WITHOUT AND WITH CONTRAST MRI CERVICAL SPINE WITHOUT AND WITH CONTRAST MRI THORACIC SPINE WITHOUT AND WITH CONTRAST TECHNIQUE: Multiplanar, multiecho pulse sequences of the brain and surrounding structures, and cervical spine, to include the craniocervical junction and cervicothoracic junction, and thoracic spine were obtained without and with  intravenous contrast. CONTRAST:  29mL GADAVIST GADOBUTROL 1 MMOL/ML IV SOLN COMPARISON:  Prior head CT from earlier the same day. FINDINGS: MRI HEAD FINDINGS Brain: Cerebral volume within normal limits. Multiple scattered foci of T2/FLAIR hyperintensity seen involving the periventricular, deep, and subcortical white matter of both cerebral hemispheres, highly suspicious for demyelinating disease. Several of these foci are oriented perpendicular to the lateral ventricles. A dominant subcortical lesion measuring 1.6 cm noted at the subcortical left parietal lobe (series 12, image 15). At least 1 infratentorial lesion seen at the left cerebellum. Following contrast administration, patchy enhancement seen about several lesions, most notable at the left parietal lobe (series 49, image 90 and right frontal lobe (series 49, image 96). No evidence for acute or subacute infarct. Gray-white matter differentiation maintained. No encephalomalacia to suggest chronic cortical infarction. No evidence for acute or chronic intracranial hemorrhage. No mass lesion, midline shift or mass effect. No hydrocephalus or extra-axial fluid collection. Pituitary gland within normal limits for age. Midline structures intact. Vascular: Major intracranial vascular flow voids are maintained. Skull and upper cervical spine: Craniocervical junction within normal limits. Bone marrow signal intensity normal. No scalp soft tissue abnormality. Sinuses/Orbits: Globes orbital soft tissues within normal limits. Small left maxillary sinus retention cyst. Paranasal sinuses are otherwise clear. No significant mastoid effusion. Inner ear structures grossly normal. Other: None. MRI CERVICAL SPINE FINDINGS Alignment: Straightening of the normal cervical lordosis. No listhesis. Vertebrae: Vertebral body height well maintained without acute or chronic fracture. Bone marrow signal intensity within normal limits. No discrete or worrisome osseous lesions. No abnormal  marrow edema or enhancement. Cord: Patchy signal abnormality seen at the medulla/craniocervical junction (series 11, image 9). Additional subtle  patchy signal abnormality within the left dorsal cord at the level of C2-3 (series 11, image 9). Focal cord signal abnormality within the central/dorsal cord at the level of C3-4 (series 11, image 8) patchy cord signal abnormality at the dorsal cord at the level of C5 (series 11, image 8). Additional vague signal abnormality at the level of C6. Findings consistent with demyelinating disease. No abnormal enhancement to suggest active demyelination. Underlying cord caliber within normal limits. Posterior Fossa, vertebral arteries, paraspinal tissues: Negative. Disc levels: No significant underlying disc pathology. No stenosis or neural impingement. MRI THORACIC SPINE FINDINGS Alignment: Vertebral bodies normally aligned with preservation of the normal thoracic kyphosis. No listhesis. Vertebrae: Vertebral body height maintained without acute or chronic fracture. Bone marrow signal intensity within normal limits. No discrete or worrisome osseous lesions. No abnormal marrow edema or enhancement. Cord: No convincing cord signal abnormality seen within the thoracic spine to suggest demyelinating disease. Underlying cord caliber within normal limits. No abnormal enhancement. Paraspinous soft tissues: Negative. Disc levels: Minimal/early spondylosis noted at T5-6 and T6-7 without significant stenosis or impingement. Minimal facet hypertrophy noted within the lower thoracic spine. No significant stenosis or neural impingement. IMPRESSION: MRI HEAD IMPRESSION: 1. Patchy multifocal signal abnormality involving both the supratentorial and infratentorial cerebral white matter, compatible with demyelinating disease/multiple sclerosis. Patchy post-contrast enhancement about several lesions consistent with active demyelination. 2. Otherwise normal brain MRI. MRI CERVICAL SPINE IMPRESSION:  1. Patchy multifocal signal abnormality throughout the cervical spinal cord extending from the brainstem through C6, consistent with demyelinating disease/multiple sclerosis. No evidence for active demyelination within the cervical spine. 2. Otherwise normal MRI of the cervical spine. MRI THORACIC SPINE IMPRESSION: Negative MRI of the thoracic spine. No convincing cord signal changes to suggest involvement with demyelinating disease. No abnormal enhancement. Electronically Signed   By: Rise Mu M.D.   On: 02/03/2020 01:03   MR THORACIC SPINE W WO CONTRAST  Result Date: 02/03/2020 EXAM: MRI HEAD WITHOUT AND WITH CONTRAST MRI CERVICAL SPINE WITHOUT AND WITH CONTRAST MRI THORACIC SPINE WITHOUT AND WITH CONTRAST TECHNIQUE: Multiplanar, multiecho pulse sequences of the brain and surrounding structures, and cervical spine, to include the craniocervical junction and cervicothoracic junction, and thoracic spine were obtained without and with intravenous contrast. CONTRAST:  9mL GADAVIST GADOBUTROL 1 MMOL/ML IV SOLN COMPARISON:  Prior head CT from earlier the same day. FINDINGS: MRI HEAD FINDINGS Brain: Cerebral volume within normal limits. Multiple scattered foci of T2/FLAIR hyperintensity seen involving the periventricular, deep, and subcortical white matter of both cerebral hemispheres, highly suspicious for demyelinating disease. Several of these foci are oriented perpendicular to the lateral ventricles. A dominant subcortical lesion measuring 1.6 cm noted at the subcortical left parietal lobe (series 12, image 15). At least 1 infratentorial lesion seen at the left cerebellum. Following contrast administration, patchy enhancement seen about several lesions, most notable at the left parietal lobe (series 49, image 90 and right frontal lobe (series 49, image 96). No evidence for acute or subacute infarct. Gray-white matter differentiation maintained. No encephalomalacia to suggest chronic cortical  infarction. No evidence for acute or chronic intracranial hemorrhage. No mass lesion, midline shift or mass effect. No hydrocephalus or extra-axial fluid collection. Pituitary gland within normal limits for age. Midline structures intact. Vascular: Major intracranial vascular flow voids are maintained. Skull and upper cervical spine: Craniocervical junction within normal limits. Bone marrow signal intensity normal. No scalp soft tissue abnormality. Sinuses/Orbits: Globes orbital soft tissues within normal limits. Small left maxillary sinus retention cyst. Paranasal sinuses  are otherwise clear. No significant mastoid effusion. Inner ear structures grossly normal. Other: None. MRI CERVICAL SPINE FINDINGS Alignment: Straightening of the normal cervical lordosis. No listhesis. Vertebrae: Vertebral body height well maintained without acute or chronic fracture. Bone marrow signal intensity within normal limits. No discrete or worrisome osseous lesions. No abnormal marrow edema or enhancement. Cord: Patchy signal abnormality seen at the medulla/craniocervical junction (series 11, image 9). Additional subtle patchy signal abnormality within the left dorsal cord at the level of C2-3 (series 11, image 9). Focal cord signal abnormality within the central/dorsal cord at the level of C3-4 (series 11, image 8) patchy cord signal abnormality at the dorsal cord at the level of C5 (series 11, image 8). Additional vague signal abnormality at the level of C6. Findings consistent with demyelinating disease. No abnormal enhancement to suggest active demyelination. Underlying cord caliber within normal limits. Posterior Fossa, vertebral arteries, paraspinal tissues: Negative. Disc levels: No significant underlying disc pathology. No stenosis or neural impingement. MRI THORACIC SPINE FINDINGS Alignment: Vertebral bodies normally aligned with preservation of the normal thoracic kyphosis. No listhesis. Vertebrae: Vertebral body height  maintained without acute or chronic fracture. Bone marrow signal intensity within normal limits. No discrete or worrisome osseous lesions. No abnormal marrow edema or enhancement. Cord: No convincing cord signal abnormality seen within the thoracic spine to suggest demyelinating disease. Underlying cord caliber within normal limits. No abnormal enhancement. Paraspinous soft tissues: Negative. Disc levels: Minimal/early spondylosis noted at T5-6 and T6-7 without significant stenosis or impingement. Minimal facet hypertrophy noted within the lower thoracic spine. No significant stenosis or neural impingement. IMPRESSION: MRI HEAD IMPRESSION: 1. Patchy multifocal signal abnormality involving both the supratentorial and infratentorial cerebral white matter, compatible with demyelinating disease/multiple sclerosis. Patchy post-contrast enhancement about several lesions consistent with active demyelination. 2. Otherwise normal brain MRI. MRI CERVICAL SPINE IMPRESSION: 1. Patchy multifocal signal abnormality throughout the cervical spinal cord extending from the brainstem through C6, consistent with demyelinating disease/multiple sclerosis. No evidence for active demyelination within the cervical spine. 2. Otherwise normal MRI of the cervical spine. MRI THORACIC SPINE IMPRESSION: Negative MRI of the thoracic spine. No convincing cord signal changes to suggest involvement with demyelinating disease. No abnormal enhancement. Electronically Signed   By: Rise Mu M.D.   On: 02/03/2020 01:03      Scheduled Meds: . insulin aspart  0-9 Units Subcutaneous TID WC  . pantoprazole  40 mg Oral Q1200   Continuous Infusions: . sodium chloride 75 mL/hr at 02/04/20 0905  . methylPREDNISolone (SOLU-MEDROL) injection 1,000 mg (02/04/20 0903)     LOS: 1 day      Time spent: 25 minutes   Noralee Stain, DO [facility] 02/04/2020, 10:57 AM   Available via Epic secure chat 7am-7pm After these hours,  please refer to coverage provider listed on [email or web address]

## 2020-02-04 NOTE — Evaluation (Signed)
Occupational Therapy Evaluation and Discharge Patient Details Name: Tami Mcpherson MRN: [id number] DOB: [birth year] Today's Date: 02/04/2020    History of Present Illness Pt is a 19 y/o female admitted secondary to R eye visual changes. 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 No pertinent PMH.    Clinical Impression   Pt is functioning independently. No OT needs. Educated importance of quality sleep and stress management and methods for managing. Pt receptive to all information and verbalized understanding.    Follow Up Recommendations  No OT follow up    Equipment Recommendations       Recommendations for Other Services       Precautions / Restrictions Precautions Precautions: None      Mobility Bed Mobility Overal bed mobility: Independent                Transfers Overall transfer level: Independent                    Balance                                           ADL either performed or assessed with clinical judgement   ADL Overall ADL's : Independent                                       General ADL Comments: Educated in importance of stress management, sleep and techniques.     Vision Patient Visual Report: Blurring of vision (R eye) Additional Comments: pt aware she is not to drive     Perception     Praxis      Pertinent Vitals/Pain Pain Assessment: No/denies pain     Hand Dominance Right   Extremity/Trunk Assessment Upper Extremity Assessment Upper Extremity Assessment: Overall WFL for tasks assessed   Lower Extremity Assessment Lower Extremity Assessment: Defer to PT evaluation   Cervical / Trunk Assessment Cervical / Trunk Assessment: Normal   Communication Communication Communication: No difficulties   Cognition Arousal/Alertness: Awake/alert Behavior During Therapy: WFL for tasks assessed/performed Overall Cognitive Status: Within Functional Limits for tasks assessed                                      General Comments       Exercises     Shoulder Instructions      Home Living Family/patient expects to be discharged to:: Private residence Living Arrangements: Parent Available Help at Discharge: Family;Available 24 hours/day Type of Home: House Home Access: Stairs to enter [employer] of Steps: 2 Entrance Stairs-Rails: Left Home Layout: One level     Bathroom Shower/Tub: Chief Strategy Officer: Standard     Home Equipment: Bedside commode          Prior Functioning/Environment Level of Independence: Independent                 OT Problem List:        OT Treatment/Interventions:      OT Goals(Current goals can be found in the care plan section) Acute Rehab OT Goals Patient Stated Goal: to go home  OT Frequency:     Barriers to D/C:  Co-evaluation              AM-PAC OT "6 Clicks" Daily Activity     Outcome Measure Help from another person eating meals?: None Help from another person taking care of personal grooming?: None Help from another person toileting, which includes using toliet, bedpan, or urinal?: None Help from another person bathing (including washing, rinsing, drying)?: None Help from another person to put on and taking off regular upper body clothing?: None Help from another person to put on and taking off regular lower body clothing?: None 6 Click Score: 24   End of Session    Activity Tolerance: Patient tolerated treatment well Patient left: in bed;with call bell/phone within reach;with family/visitor present  OT Visit Diagnosis: Other (comment) (impaired vision)                Time: 1355-1414 OT Time Calculation (min): 19 min Charges:  OT General Charges $OT Visit: 1 Visit OT Evaluation $OT Eval Low Complexity: 1 Low  [name], OTR/L Acute Rehabilitation Services Pager: [phone] Office: [phone]  [name] 02/04/2020, 2:23 PM

## 2020-02-04 NOTE — Discharge Summary (Addendum)
Physician Discharge Summary  Tami Mcpherson ZOX:[id number] DOB: 02-21-2001 DOA: 02/02/2020  PCP: Lester Haleburg., MD  Admit date: 02/02/2020 Discharge date: 02/04/2020  Admitted From: Home Disposition:  Home  Recommendations for Outpatient Follow-up:  1. Follow up with Dr. Antonietta Barcelona Neurology as soon as possible   Discharge Condition: Stable CODE STATUS: Full  Diet recommendation: Regular   Brief/Interim Summary: Tami Mcpherson a 19 y.o.femalewithhistory of migraine(has not had any migraine attacks for more than 2 years)who has been recently experiencing right upper and lower extremity numbness off and on had seen neurologist in July 2021 started experiencing right IV retro-orbital pain with progressive blurriness of the vision. In the ER,CT head shows possible mass versus demyelinating disease and patient was transferred from [facility] to [facility]. At [facility] MRI brain C-spine and T-spine were done which shows demyelinating lesions concerning for multiple sclerosis. On-call neurologist was consulted patient started on IV steroids. She received IV solumderol in the hospital and was set up to complete total 5 day course as outpatient.   Discharge Diagnoses:  Principal Problem:   Demyelinating disease (HCC) Active Problems:   Multiple sclerosis (HCC)   MS -MRI brain:Patchy multifocal signal abnormality involving both the supratentorial and infratentorial cerebral white matter, compatible with demyelinating disease/multiple sclerosis. Patchy post-contrast enhancement about several lesions consistent with active demyelination. -MRI cervical spine:Patchy multifocal signal abnormality throughout the cervical spinal cord extending from the brainstem through C6, consistent with demyelinating disease/multiple sclerosis. No evidence for active demyelination within the cervical spine. -MRI thoracic spine:Negative MRI of the thoracic spine -Appreciate  neurology -IV Solu-Medrol x5 days (9/13-9/17) -Need outpatient neurology follow up with her primary Neurology at [facility], Dr. Antonietta Barcelona    Discharge Instructions  Discharge Instructions    Call MD for:  difficulty breathing, headache or visual disturbances   Complete by: As directed    Call MD for:  extreme fatigue   Complete by: As directed    Call MD for:  persistant dizziness or light-headedness   Complete by: As directed    Call MD for:  persistant nausea and vomiting   Complete by: As directed    Call MD for:  severe uncontrolled pain   Complete by: As directed    Call MD for:  temperature >100.4   Complete by: As directed    Diet general   Complete by: As directed    Discharge instructions   Complete by: As directed    You were cared for by a hospitalist during your hospital stay. If you have any questions about your discharge medications or the care you received while you were in the hospital after you are discharged, you can call the unit and ask to speak with the hospitalist on call if the hospitalist that took care of you is not available. Once you are discharged, your primary care physician will handle any further medical issues. Please note that NO REFILLS for any discharge medications will be authorized once you are discharged, as it is imperative that you return to your primary care physician (or establish a relationship with a primary care physician if you do not have one) for your aftercare needs so that they can reassess your need for medications and monitor your lab values.   Discharge instructions   Complete by: As directed    Return to hospital, daily 9/15, 9/16, 9/17, for IV solumedrol infusion to complete total 5 day treatment. This has already been arranged for you.   Increase  activity slowly   Complete by: As directed      Allergies as of 02/04/2020      Reactions   Nickel Itching      Medication List    TAKE these medications   ascorbic acid 500 MG  tablet Commonly known as: VITAMIN C Take 500 mg by mouth daily.   Nexplanon 68 MG Impl implant Generic drug: etonogestrel 1 each by Subdermal route once.       Follow-up Information    Curt Bears, MD. Call.   Specialty: Neurology Why: Call to make an appointment as soon as discharged Contact information: [address] [place] [address] [phone]              Allergies  Allergen Reactions  . Nickel Itching    Consultations:  Neurology    Procedures/Studies: CT Head Wo Contrast  Result Date: 02/02/2020 CLINICAL DATA:  Headache, migraine without history of trauma. Nausea and vomiting this morning. EXAM: CT HEAD WITHOUT CONTRAST TECHNIQUE: Contiguous axial images were obtained from the base of the skull through the vertex without intravenous contrast. COMPARISON:  Oct 07, 2016 FINDINGS: Brain: Vague area of hypodensity in the posterior LEFT parietal lobe. This is at the gray-white junction with some extension towards the cortex but without frank involvement of the cortex. No intracranial hemorrhage. No mass effect. No midline shift. No abnormal extra-axial fluid collection. Vascular: No hyperdense vessel or unexpected calcification. Skull: Normal. Negative for fracture or focal lesion. Sinuses/Orbits: Sinuses are incompletely imaged without acute abnormality. Orbits without acute process. Other: None IMPRESSION: 1. Vague area of hypoattenuation in subcortical white matter of the LEFT parietal lobe is of uncertain significance, suspicious for recent ischemia, underlying mass lesion or demyelinating process among other differential considerations. Brain MRI is suggested for further evaluation. These results were called by telephone at the time of interpretation on 02/02/2020 at 7:53 pm to provider DAVID YAO , who verbally acknowledged these results. Electronically Signed   By: Donzetta Kohut M.D.   On: 02/02/2020 19:50   MR Brain W and Wo  Contrast  Result Date: 02/03/2020 EXAM: MRI HEAD WITHOUT AND WITH CONTRAST MRI CERVICAL SPINE WITHOUT AND WITH CONTRAST MRI THORACIC SPINE WITHOUT AND WITH CONTRAST TECHNIQUE: Multiplanar, multiecho pulse sequences of the brain and surrounding structures, and cervical spine, to include the craniocervical junction and cervicothoracic junction, and thoracic spine were obtained without and with intravenous contrast. CONTRAST:  19mL GADAVIST GADOBUTROL 1 MMOL/ML IV SOLN COMPARISON:  Prior head CT from earlier the same day. FINDINGS: MRI HEAD FINDINGS Brain: Cerebral volume within normal limits. Multiple scattered foci of T2/FLAIR hyperintensity seen involving the periventricular, deep, and subcortical white matter of both cerebral hemispheres, highly suspicious for demyelinating disease. Several of these foci are oriented perpendicular to the lateral ventricles. A dominant subcortical lesion measuring 1.6 cm noted at the subcortical left parietal lobe (series 12, image 15). At least 1 infratentorial lesion seen at the left cerebellum. Following contrast administration, patchy enhancement seen about several lesions, most notable at the left parietal lobe (series 49, image 90 and right frontal lobe (series 49, image 96). No evidence for acute or subacute infarct. Gray-white matter differentiation maintained. No encephalomalacia to suggest chronic cortical infarction. No evidence for acute or chronic intracranial hemorrhage. No mass lesion, midline shift or mass effect. No hydrocephalus or extra-axial fluid collection. Pituitary gland within normal limits for age. Midline structures intact. Vascular: Major intracranial vascular flow voids are maintained. Skull and upper cervical spine:  Craniocervical junction within normal limits. Bone marrow signal intensity normal. No scalp soft tissue abnormality. Sinuses/Orbits: Globes orbital soft tissues within normal limits. Small left maxillary sinus retention cyst. Paranasal  sinuses are otherwise clear. No significant mastoid effusion. Inner ear structures grossly normal. Other: None. MRI CERVICAL SPINE FINDINGS Alignment: Straightening of the normal cervical lordosis. No listhesis. Vertebrae: Vertebral body height well maintained without acute or chronic fracture. Bone marrow signal intensity within normal limits. No discrete or worrisome osseous lesions. No abnormal marrow edema or enhancement. Cord: Patchy signal abnormality seen at the medulla/craniocervical junction (series 11, image 9). Additional subtle patchy signal abnormality within the left dorsal cord at the level of C2-3 (series 11, image 9). Focal cord signal abnormality within the central/dorsal cord at the level of C3-4 (series 11, image 8) patchy cord signal abnormality at the dorsal cord at the level of C5 (series 11, image 8). Additional vague signal abnormality at the level of C6. Findings consistent with demyelinating disease. No abnormal enhancement to suggest active demyelination. Underlying cord caliber within normal limits. Posterior Fossa, vertebral arteries, paraspinal tissues: Negative. Disc levels: No significant underlying disc pathology. No stenosis or neural impingement. MRI THORACIC SPINE FINDINGS Alignment: Vertebral bodies normally aligned with preservation of the normal thoracic kyphosis. No listhesis. Vertebrae: Vertebral body height maintained without acute or chronic fracture. Bone marrow signal intensity within normal limits. No discrete or worrisome osseous lesions. No abnormal marrow edema or enhancement. Cord: No convincing cord signal abnormality seen within the thoracic spine to suggest demyelinating disease. Underlying cord caliber within normal limits. No abnormal enhancement. Paraspinous soft tissues: Negative. Disc levels: Minimal/early spondylosis noted at T5-6 and T6-7 without significant stenosis or impingement. Minimal facet hypertrophy noted within the lower thoracic spine. No  significant stenosis or neural impingement. IMPRESSION: MRI HEAD IMPRESSION: 1. Patchy multifocal signal abnormality involving both the supratentorial and infratentorial cerebral white matter, compatible with demyelinating disease/multiple sclerosis. Patchy post-contrast enhancement about several lesions consistent with active demyelination. 2. Otherwise normal brain MRI. MRI CERVICAL SPINE IMPRESSION: 1. Patchy multifocal signal abnormality throughout the cervical spinal cord extending from the brainstem through C6, consistent with demyelinating disease/multiple sclerosis. No evidence for active demyelination within the cervical spine. 2. Otherwise normal MRI of the cervical spine. MRI THORACIC SPINE IMPRESSION: Negative MRI of the thoracic spine. No convincing cord signal changes to suggest involvement with demyelinating disease. No abnormal enhancement. Electronically Signed   By: Rise Mu M.D.   On: 02/03/2020 01:03   MR Cervical Spine W or Wo Contrast  Result Date: 02/03/2020 EXAM: MRI HEAD WITHOUT AND WITH CONTRAST MRI CERVICAL SPINE WITHOUT AND WITH CONTRAST MRI THORACIC SPINE WITHOUT AND WITH CONTRAST TECHNIQUE: Multiplanar, multiecho pulse sequences of the brain and surrounding structures, and cervical spine, to include the craniocervical junction and cervicothoracic junction, and thoracic spine were obtained without and with intravenous contrast. CONTRAST:  9mL GADAVIST GADOBUTROL 1 MMOL/ML IV SOLN COMPARISON:  Prior head CT from earlier the same day. FINDINGS: MRI HEAD FINDINGS Brain: Cerebral volume within normal limits. Multiple scattered foci of T2/FLAIR hyperintensity seen involving the periventricular, deep, and subcortical white matter of both cerebral hemispheres, highly suspicious for demyelinating disease. Several of these foci are oriented perpendicular to the lateral ventricles. A dominant subcortical lesion measuring 1.6 cm noted at the subcortical left parietal lobe (series  12, image 15). At least 1 infratentorial lesion seen at the left cerebellum. Following contrast administration, patchy enhancement seen about several lesions, most notable at the left parietal lobe (series  49, image 90 and right frontal lobe (series 49, image 96). No evidence for acute or subacute infarct. Gray-white matter differentiation maintained. No encephalomalacia to suggest chronic cortical infarction. No evidence for acute or chronic intracranial hemorrhage. No mass lesion, midline shift or mass effect. No hydrocephalus or extra-axial fluid collection. Pituitary gland within normal limits for age. Midline structures intact. Vascular: Major intracranial vascular flow voids are maintained. Skull and upper cervical spine: Craniocervical junction within normal limits. Bone marrow signal intensity normal. No scalp soft tissue abnormality. Sinuses/Orbits: Globes orbital soft tissues within normal limits. Small left maxillary sinus retention cyst. Paranasal sinuses are otherwise clear. No significant mastoid effusion. Inner ear structures grossly normal. Other: None. MRI CERVICAL SPINE FINDINGS Alignment: Straightening of the normal cervical lordosis. No listhesis. Vertebrae: Vertebral body height well maintained without acute or chronic fracture. Bone marrow signal intensity within normal limits. No discrete or worrisome osseous lesions. No abnormal marrow edema or enhancement. Cord: Patchy signal abnormality seen at the medulla/craniocervical junction (series 11, image 9). Additional subtle patchy signal abnormality within the left dorsal cord at the level of C2-3 (series 11, image 9). Focal cord signal abnormality within the central/dorsal cord at the level of C3-4 (series 11, image 8) patchy cord signal abnormality at the dorsal cord at the level of C5 (series 11, image 8). Additional vague signal abnormality at the level of C6. Findings consistent with demyelinating disease. No abnormal enhancement to suggest  active demyelination. Underlying cord caliber within normal limits. Posterior Fossa, vertebral arteries, paraspinal tissues: Negative. Disc levels: No significant underlying disc pathology. No stenosis or neural impingement. MRI THORACIC SPINE FINDINGS Alignment: Vertebral bodies normally aligned with preservation of the normal thoracic kyphosis. No listhesis. Vertebrae: Vertebral body height maintained without acute or chronic fracture. Bone marrow signal intensity within normal limits. No discrete or worrisome osseous lesions. No abnormal marrow edema or enhancement. Cord: No convincing cord signal abnormality seen within the thoracic spine to suggest demyelinating disease. Underlying cord caliber within normal limits. No abnormal enhancement. Paraspinous soft tissues: Negative. Disc levels: Minimal/early spondylosis noted at T5-6 and T6-7 without significant stenosis or impingement. Minimal facet hypertrophy noted within the lower thoracic spine. No significant stenosis or neural impingement. IMPRESSION: MRI HEAD IMPRESSION: 1. Patchy multifocal signal abnormality involving both the supratentorial and infratentorial cerebral white matter, compatible with demyelinating disease/multiple sclerosis. Patchy post-contrast enhancement about several lesions consistent with active demyelination. 2. Otherwise normal brain MRI. MRI CERVICAL SPINE IMPRESSION: 1. Patchy multifocal signal abnormality throughout the cervical spinal cord extending from the brainstem through C6, consistent with demyelinating disease/multiple sclerosis. No evidence for active demyelination within the cervical spine. 2. Otherwise normal MRI of the cervical spine. MRI THORACIC SPINE IMPRESSION: Negative MRI of the thoracic spine. No convincing cord signal changes to suggest involvement with demyelinating disease. No abnormal enhancement. Electronically Signed   By: Rise Mu M.D.   On: 02/03/2020 01:03   MR THORACIC SPINE W WO  CONTRAST  Result Date: 02/03/2020 EXAM: MRI HEAD WITHOUT AND WITH CONTRAST MRI CERVICAL SPINE WITHOUT AND WITH CONTRAST MRI THORACIC SPINE WITHOUT AND WITH CONTRAST TECHNIQUE: Multiplanar, multiecho pulse sequences of the brain and surrounding structures, and cervical spine, to include the craniocervical junction and cervicothoracic junction, and thoracic spine were obtained without and with intravenous contrast. CONTRAST:  9mL GADAVIST GADOBUTROL 1 MMOL/ML IV SOLN COMPARISON:  Prior head CT from earlier the same day. FINDINGS: MRI HEAD FINDINGS Brain: Cerebral volume within normal limits. Multiple scattered foci of T2/FLAIR hyperintensity seen involving  the periventricular, deep, and subcortical white matter of both cerebral hemispheres, highly suspicious for demyelinating disease. Several of these foci are oriented perpendicular to the lateral ventricles. A dominant subcortical lesion measuring 1.6 cm noted at the subcortical left parietal lobe (series 12, image 15). At least 1 infratentorial lesion seen at the left cerebellum. Following contrast administration, patchy enhancement seen about several lesions, most notable at the left parietal lobe (series 49, image 90 and right frontal lobe (series 49, image 96). No evidence for acute or subacute infarct. Gray-white matter differentiation maintained. No encephalomalacia to suggest chronic cortical infarction. No evidence for acute or chronic intracranial hemorrhage. No mass lesion, midline shift or mass effect. No hydrocephalus or extra-axial fluid collection. Pituitary gland within normal limits for age. Midline structures intact. Vascular: Major intracranial vascular flow voids are maintained. Skull and upper cervical spine: Craniocervical junction within normal limits. Bone marrow signal intensity normal. No scalp soft tissue abnormality. Sinuses/Orbits: Globes orbital soft tissues within normal limits. Small left maxillary sinus retention cyst. Paranasal  sinuses are otherwise clear. No significant mastoid effusion. Inner ear structures grossly normal. Other: None. MRI CERVICAL SPINE FINDINGS Alignment: Straightening of the normal cervical lordosis. No listhesis. Vertebrae: Vertebral body height well maintained without acute or chronic fracture. Bone marrow signal intensity within normal limits. No discrete or worrisome osseous lesions. No abnormal marrow edema or enhancement. Cord: Patchy signal abnormality seen at the medulla/craniocervical junction (series 11, image 9). Additional subtle patchy signal abnormality within the left dorsal cord at the level of C2-3 (series 11, image 9). Focal cord signal abnormality within the central/dorsal cord at the level of C3-4 (series 11, image 8) patchy cord signal abnormality at the dorsal cord at the level of C5 (series 11, image 8). Additional vague signal abnormality at the level of C6. Findings consistent with demyelinating disease. No abnormal enhancement to suggest active demyelination. Underlying cord caliber within normal limits. Posterior Fossa, vertebral arteries, paraspinal tissues: Negative. Disc levels: No significant underlying disc pathology. No stenosis or neural impingement. MRI THORACIC SPINE FINDINGS Alignment: Vertebral bodies normally aligned with preservation of the normal thoracic kyphosis. No listhesis. Vertebrae: Vertebral body height maintained without acute or chronic fracture. Bone marrow signal intensity within normal limits. No discrete or worrisome osseous lesions. No abnormal marrow edema or enhancement. Cord: No convincing cord signal abnormality seen within the thoracic spine to suggest demyelinating disease. Underlying cord caliber within normal limits. No abnormal enhancement. Paraspinous soft tissues: Negative. Disc levels: Minimal/early spondylosis noted at T5-6 and T6-7 without significant stenosis or impingement. Minimal facet hypertrophy noted within the lower thoracic spine. No  significant stenosis or neural impingement. IMPRESSION: MRI HEAD IMPRESSION: 1. Patchy multifocal signal abnormality involving both the supratentorial and infratentorial cerebral white matter, compatible with demyelinating disease/multiple sclerosis. Patchy post-contrast enhancement about several lesions consistent with active demyelination. 2. Otherwise normal brain MRI. MRI CERVICAL SPINE IMPRESSION: 1. Patchy multifocal signal abnormality throughout the cervical spinal cord extending from the brainstem through C6, consistent with demyelinating disease/multiple sclerosis. No evidence for active demyelination within the cervical spine. 2. Otherwise normal MRI of the cervical spine. MRI THORACIC SPINE IMPRESSION: Negative MRI of the thoracic spine. No convincing cord signal changes to suggest involvement with demyelinating disease. No abnormal enhancement. Electronically Signed   By: Rise Mu M.D.   On: 02/03/2020 01:03       Discharge Exam: Vitals:   02/04/20 0645 02/04/20 1230  BP: 117/70 139/62  Pulse: 70 69  Resp: 16 16  Temp: 98.4  F (36.9 C) 98.4 F (36.9 C)  SpO2: 98% 99%    General: Pt is alert, awake, not in acute distress Cardiovascular: RRR, S1/S2 +, no edema Respiratory: CTA bilaterally, no wheezing, no rhonchi, no respiratory distress, no conversational dyspnea  Abdominal: Soft, NT, ND, bowel sounds + Extremities: no edema, no cyanosis Psych: Normal mood and affect, stable judgement and insight     The results of significant diagnostics from this hospitalization (including imaging, microbiology, ancillary and laboratory) are listed below for reference.     Microbiology: Recent Results (from the past 240 hour(s))  SARS Coronavirus 2 by RT PCR (hospital order, performed in [facility] hospital lab) Nasopharyngeal Nasopharyngeal Swab     Status: None   Collection Time: 02/03/20  2:20 AM   Specimen: Nasopharyngeal Swab  Result Value Ref Range Status   SARS  Coronavirus 2 NEGATIVE NEGATIVE Final    Comment: (NOTE) SARS-CoV-2 target nucleic acids are NOT DETECTED.  The SARS-CoV-2 RNA is generally detectable in upper and lower respiratory specimens during the acute phase of infection. The lowest concentration of SARS-CoV-2 viral copies this assay can detect is 250 copies / mL. A negative result does not preclude SARS-CoV-2 infection and should not be used as the sole basis for treatment or other patient management decisions.  A negative result may occur with improper specimen collection / handling, submission of specimen other than nasopharyngeal swab, presence of viral mutation(s) within the areas targeted by this assay, and inadequate number of viral copies (<250 copies / mL). A negative result must be combined with clinical observations, patient history, and epidemiological information.  Fact Sheet for Patients:   BoilerBrush.com.cy  Fact Sheet for Healthcare Providers: https://pope.com/  This test is not yet approved or  cleared by the [place] FDA and has been authorized for detection and/or diagnosis of SARS-CoV-2 by FDA under an Emergency Use Authorization (EUA).  This EUA will remain in effect (meaning this test can be used) for the duration of the COVID-19 declaration under Section 564(b)(1) of the Act, 21 U.S.C. section 360bbb-3(b)(1), unless the authorization is terminated or revoked sooner.  Performed at [facility], [address] [place], [place] [address]      Labs: BNP (last 3 results) No results for input(s): BNP in the last 8760 hours. Basic Metabolic Panel: Recent Labs  Lab 02/02/20 1938 02/03/20 0513  NA 141 138  K 4.0 4.1  CL 105 107  CO2 25 18*  GLUCOSE 93 136*  BUN 7 7  CREATININE 0.66 0.67  CALCIUM 9.5 9.6   Liver Function Tests: No results for input(s): AST, ALT, ALKPHOS, BILITOT, PROT, ALBUMIN in the last 168 hours. No results for  input(s): LIPASE, AMYLASE in the last 168 hours. No results for input(s): AMMONIA in the last 168 hours. CBC: Recent Labs  Lab 02/02/20 1938 02/03/20 0513  WBC 7.2 7.7  NEUTROABS 3.7  --   HGB 13.8 14.3  HCT 41.8 42.7  MCV 87.6 88.0  PLT 258 263   Cardiac Enzymes: No results for input(s): CKTOTAL, CKMB, CKMBINDEX, TROPONINI in the last 168 hours. BNP: Invalid input(s): POCBNP CBG: Recent Labs  Lab 02/03/20 1147 02/03/20 1609 02/03/20 2128 02/04/20 0644 02/04/20 1120  GLUCAP 262* 242* 153* 107* 134*   D-Dimer No results for input(s): DDIMER in the last 72 hours. Hgb A1c No results for input(s): HGBA1C in the last 72 hours. Lipid Profile No results for input(s): CHOL, HDL, LDLCALC, TRIG, CHOLHDL, LDLDIRECT in the last 72  hours. Thyroid function studies No results for input(s): TSH, T4TOTAL, T3FREE, THYROIDAB in the last 72 hours.  Invalid input(s): FREET3 Anemia work up No results for input(s): VITAMINB12, FOLATE, FERRITIN, TIBC, IRON, RETICCTPCT in the last 72 hours. Urinalysis No results found for: COLORURINE, APPEARANCEUR, LABSPEC, PHURINE, GLUCOSEU, HGBUR, BILIRUBINUR, KETONESUR, PROTEINUR, UROBILINOGEN, NITRITE, LEUKOCYTESUR Sepsis Labs Invalid input(s): PROCALCITONIN,  WBC,  LACTICIDVEN Microbiology Recent Results (from the past 240 hour(s))  SARS Coronavirus 2 by RT PCR (hospital order, performed in [facility] hospital lab) Nasopharyngeal Nasopharyngeal Swab     Status: None   Collection Time: 02/03/20  2:20 AM   Specimen: Nasopharyngeal Swab  Result Value Ref Range Status   SARS Coronavirus 2 NEGATIVE NEGATIVE Final    Comment: (NOTE) SARS-CoV-2 target nucleic acids are NOT DETECTED.  The SARS-CoV-2 RNA is generally detectable in upper and lower respiratory specimens during the acute phase of infection. The lowest concentration of SARS-CoV-2 viral copies this assay can detect is 250 copies / mL. A negative result does not preclude SARS-CoV-2  infection and should not be used as the sole basis for treatment or other patient management decisions.  A negative result may occur with improper specimen collection / handling, submission of specimen other than nasopharyngeal swab, presence of viral mutation(s) within the areas targeted by this assay, and inadequate number of viral copies (<250 copies / mL). A negative result must be combined with clinical observations, patient history, and epidemiological information.  Fact Sheet for Patients:   BoilerBrush.com.cy  Fact Sheet for Healthcare Providers: https://pope.com/  This test is not yet approved or  cleared by the [place] FDA and has been authorized for detection and/or diagnosis of SARS-CoV-2 by FDA under an Emergency Use Authorization (EUA).  This EUA will remain in effect (meaning this test can be used) for the duration of the COVID-19 declaration under Section 564(b)(1) of the Act, 21 U.S.C. section 360bbb-3(b)(1), unless the authorization is terminated or revoked sooner.  Performed at [facility], [address] [place], [place] [address]      Patient was seen and examined on the day of discharge and was found to be in stable condition. Time coordinating discharge: 35 minutes including assessment and coordination of care, coordinating outpatient infusion of solumedrol, as well as examination of the patient and discussion with step-mom.   SIGNED:  Noralee Stain, DO [facility] 02/04/2020, 2:16 PM

## 2020-02-04 NOTE — Progress Notes (Signed)
Tami Mcpherson to be D/C'd Home per MD order.  Discussed with the patient and all questions fully answered.   VSS, Skin clean, dry and intact without evidence of skin break down, no evidence of skin tears noted. IV catheter discontinued intact. Site without signs and symptoms of complications. Dressing and pressure applied.   An After Visit Summary was printed and given to the patient.    D/C education completed with patient/family including follow up instructions, medication list, d/c activities limitations if indicated, with other d/c instructions as indicated by MD - patient able to verbalize understanding, all questions fully answered.    Patient instructed to return to ED, call 911, or call MD for any changes in condition.    Patient escorted via WC, and D/C home via car.

## 2020-02-05 ENCOUNTER — Other Ambulatory Visit: Payer: Self-pay

## 2020-02-05 ENCOUNTER — Other Ambulatory Visit: Payer: Self-pay | Admitting: Family Medicine

## 2020-02-05 ENCOUNTER — Encounter (HOSPITAL_COMMUNITY)
Admission: RE | Admit: 2020-02-05 | Discharge: 2020-02-05 | Disposition: A | Discharge status: Home / Self Care | Payer: BLUE CROSS/BLUE SHIELD | Source: Ambulatory Visit | Attending: Family Medicine | Admitting: Family Medicine

## 2020-02-05 ENCOUNTER — Other Ambulatory Visit (HOSPITAL_COMMUNITY): Payer: Self-pay | Admitting: *Deleted

## 2020-02-05 DIAGNOSIS — G35 Multiple sclerosis: Secondary | ICD-10-CM | POA: Insufficient documentation

## 2020-02-05 MED ORDER — SODIUM CHLORIDE 0.9 % IV SOLN: 1000.0000 mg | Freq: Once | INTRAVENOUS | Status: DC

## 2020-02-05 MED ORDER — SODIUM CHLORIDE 0.9 % IV SOLN
1000.0000 mg | INTRAVENOUS | Status: DC
  Administered 2020-02-05: 1000 mg via INTRAVENOUS
  Filled 2020-02-05: qty 8

## 2020-02-05 NOTE — Progress Notes (Signed)
Patient admitted with optic neuritis.  Arrangements made prior to discharge for solu-medrol infusion 1000 mg once daily for 3 days at [facility] infusion center.  Subsequently found that [facility] and [facility] are out of network for her insurance.  Team was able to verify in-network infusion center, but required release of orders to be faxed out of system.  Patient to receive Solu-medrol 1g every 24 hours 02/05/20-02/07/20 and has follow up with her neurologist arranged.

## 2020-02-06 ENCOUNTER — Encounter (HOSPITAL_COMMUNITY): Payer: Medicaid Other

## 2020-02-06 NOTE — Progress Notes (Signed)
Patient received her first dose of IV solumedrol at [facility] and for the last 2 infusions is arranged for home  With services provided by [facility] arranged by [facility]. Patient is aware and is in agreement with the plan. Alexis Goodell Transition of Care Supervisor [phone]

## 2020-02-07 ENCOUNTER — Encounter (HOSPITAL_COMMUNITY): Payer: Medicaid Other

## 2020-02-07 LAB — ANTI-DNASE B ANTIBODY: Anti-DNAse-B: 90 U/mL (ref 0–120)

## 2020-02-09 LAB — ZINC: Zinc: 82 ug/dL (ref 44–115)

## 2020-02-09 LAB — COPPER, SERUM: Copper: 113 ug/dL (ref 80–158)

## 2020-03-05 ENCOUNTER — Ambulatory Visit: Payer: Medicaid Other | Admitting: Neurology
